# Patient Record
Sex: Male | Born: 1935 | Race: White | Hispanic: No | Smoking: Never smoker
Health system: Southern US, Community
[De-identification: ages and names within clinical notes are randomized; demographics above are authoritative.]

## PROBLEM LIST (undated history)

## (undated) DIAGNOSIS — I10 Essential (primary) hypertension: Secondary | ICD-10-CM

## (undated) DIAGNOSIS — F329 Major depressive disorder, single episode, unspecified: Secondary | ICD-10-CM

## (undated) DIAGNOSIS — N400 Enlarged prostate without lower urinary tract symptoms: Secondary | ICD-10-CM

## (undated) DIAGNOSIS — G309 Alzheimer's disease, unspecified: Secondary | ICD-10-CM

## (undated) DIAGNOSIS — F0280 Dementia in other diseases classified elsewhere without behavioral disturbance: Secondary | ICD-10-CM

---

## 2017-09-24 ENCOUNTER — Emergency Department (HOSPITAL_COMMUNITY): Payer: Medicare Other

## 2017-09-24 ENCOUNTER — Emergency Department (HOSPITAL_COMMUNITY)
Admission: EM | Admit: 2017-09-24 | Discharge: 2017-09-24 | Disposition: A | Discharge status: Home / Self Care | Payer: Medicare Other | Source: Home / Self Care | Attending: Emergency Medicine | Admitting: Emergency Medicine

## 2017-09-24 ENCOUNTER — Encounter (HOSPITAL_COMMUNITY): Payer: Self-pay | Admitting: Emergency Medicine

## 2017-09-24 ENCOUNTER — Encounter (HOSPITAL_COMMUNITY): Payer: Self-pay

## 2017-09-24 ENCOUNTER — Emergency Department (HOSPITAL_COMMUNITY)
Admission: EM | Admit: 2017-09-24 | Discharge: 2017-09-24 | Disposition: A | Discharge status: Skilled Nursing Facility | Payer: Medicare Other | Attending: Emergency Medicine | Admitting: Emergency Medicine

## 2017-09-24 ENCOUNTER — Other Ambulatory Visit: Payer: Self-pay

## 2017-09-24 DIAGNOSIS — G309 Alzheimer's disease, unspecified: Secondary | ICD-10-CM | POA: Insufficient documentation

## 2017-09-24 DIAGNOSIS — Y999 Unspecified external cause status: Secondary | ICD-10-CM | POA: Diagnosis not present

## 2017-09-24 DIAGNOSIS — W06XXXA Fall from bed, initial encounter: Secondary | ICD-10-CM | POA: Diagnosis not present

## 2017-09-24 DIAGNOSIS — W19XXXA Unspecified fall, initial encounter: Secondary | ICD-10-CM

## 2017-09-24 DIAGNOSIS — S0990XA Unspecified injury of head, initial encounter: Secondary | ICD-10-CM

## 2017-09-24 DIAGNOSIS — I10 Essential (primary) hypertension: Secondary | ICD-10-CM

## 2017-09-24 DIAGNOSIS — Y92122 Bedroom in nursing home as the place of occurrence of the external cause: Secondary | ICD-10-CM | POA: Insufficient documentation

## 2017-09-24 DIAGNOSIS — Y939 Activity, unspecified: Secondary | ICD-10-CM | POA: Insufficient documentation

## 2017-09-24 DIAGNOSIS — S0101XA Laceration without foreign body of scalp, initial encounter: Secondary | ICD-10-CM | POA: Diagnosis present

## 2017-09-24 DIAGNOSIS — S0990XD Unspecified injury of head, subsequent encounter: Secondary | ICD-10-CM | POA: Insufficient documentation

## 2017-09-24 HISTORY — DX: Dementia in other diseases classified elsewhere, unspecified severity, without behavioral disturbance, psychotic disturbance, mood disturbance, and anxiety: F02.80

## 2017-09-24 HISTORY — DX: Benign prostatic hyperplasia without lower urinary tract symptoms: N40.0

## 2017-09-24 HISTORY — DX: Essential (primary) hypertension: I10

## 2017-09-24 HISTORY — DX: Major depressive disorder, single episode, unspecified: F32.9

## 2017-09-24 HISTORY — DX: Alzheimer's disease, unspecified: G30.9

## 2017-09-24 NOTE — ED Notes (Signed)
[facility] Expected date:  Expected time:  Means of arrival:  Comments: [facility] 84 [facility] head

## 2017-09-24 NOTE — ED Notes (Signed)
Pt diaper changed. EAting meal.

## 2017-09-24 NOTE — ED Provider Notes (Addendum)
[facility]   CSN: [id number] Arrival date & time: 09/24/17  0235     History   Chief Complaint Chief Complaint  Patient presents with  . Fall  . Head Laceration    HPI Andrew GibsonMarvin Henderson is a 84 y.o. male.  The history is provided by the EMS personnel. The history is limited by the condition of the patient (level 5 due to dementia).  Fall  This is a new problem. The current episode started less than 1 hour ago. The problem occurs constantly. The problem has not changed since onset.Pertinent negatives include no abdominal pain and no shortness of breath. Nothing aggravates the symptoms. Nothing relieves the symptoms. He has tried nothing for the symptoms. The treatment provided no relief.    Past Medical History:  Diagnosis Date  . Alzheimer's dementia   . Enlarged prostate   . Hypertension   . Major depressive disorder     There are no active problems to display for this patient.   History reviewed. No pertinent surgical history.     Home Medications    Prior to Admission medications   Not on File    Family History No family history on file.  Social History Social History   Tobacco Use  . Smoking status: Unknown If Ever Smoked  Substance Use Topics  . Alcohol use: No    Frequency: Never  . Drug use: No     Allergies   Nifedipine er   Review of Systems Review of Systems  Unable to perform ROS: Dementia  Constitutional: Negative for fever.  Respiratory: Negative for shortness of breath.   Gastrointestinal: Negative for abdominal pain.     Physical Exam Updated Vital Signs BP 112/82 (BP Location: Left Arm)   Pulse 64   Temp 98.4 F (36.9 C) (Oral)   Resp 17   SpO2 95%   Physical Exam  Constitutional: He appears well-developed and well-nourished. No distress.  HENT:  Head: Normocephalic.  Right Ear: External ear normal.  Left Ear: External ear normal.  Mouth/Throat: Oropharynx is clear and  moist. No oropharyngeal exudate.  8 mm laceration of the right temporal bone  Eyes: Conjunctivae are normal. Pupils are equal, round, and reactive to light.  Neck: Normal range of motion. Neck supple.  Cardiovascular: Normal rate, regular rhythm, normal heart sounds and intact distal pulses.  Pulmonary/Chest: Effort normal and breath sounds normal. No stridor. He has no wheezes. He has no rales.  Abdominal: Soft. Bowel sounds are normal. He exhibits no mass. There is no tenderness. There is no rebound and no guarding.  Musculoskeletal: Normal range of motion. He exhibits no deformity.       Right wrist: Normal.       Left wrist: Normal.       Right hip: Normal.       Left hip: Normal.       Right knee: Normal.       Left knee: Normal.       Right ankle: Normal. Achilles tendon normal.       Left ankle: Normal. Achilles tendon normal.       Cervical back: Normal.       Thoracic back: Normal.  Neurological: He is alert. He displays normal reflexes.  Skin: Skin is warm and dry. Capillary refill takes less than 2 seconds.     ED Treatments / Results   Vitals:   09/24/17 0246 09/24/17 0249  BP: 112/82   Pulse:  64   Resp: 17   Temp: 98.4 F (36.9 C)   SpO2: 95% 95%    Procedures .Marland KitchenLaceration Repair Date/Time: 09/24/2017 5:23 AM Performed by: Cy Blamer, MD Authorized by: Cy Blamer, MD   Consent:    Consent obtained:  Emergent situation   Risks discussed:  Infection, pain, poor cosmetic result, poor wound healing, nerve damage, need for additional repair, retained foreign body, tendon damage and vascular damage   Alternatives discussed:  No treatment Anesthesia (see MAR for exact dosages):    Anesthesia method:  None Laceration details:    Location:  Scalp   Scalp location:  R temporal   Length (cm):  0.8   Depth (mm):  1 Repair type:    Repair type:  Simple Pre-procedure details:    Preparation:  Patient was prepped and draped in usual sterile  fashion Exploration:    Hemostasis achieved with:  Direct pressure   Wound exploration: wound explored through full range of motion     Wound extent: no areolar tissue violation noted   Treatment:    Area cleansed with:  Hibiclens and Betadine   Amount of cleaning:  Extensive Skin repair:    Repair method:  Staples   Number of staples:  1 Approximation:    Approximation:  Close   Vermilion border: well-aligned   Post-procedure details:    Dressing:  Open (no dressing)   Patient tolerance of procedure:  Tolerated well, no immediate complications   (including critical care time)    Final Clinical Impressions(s) / ED Diagnoses  Return for weakness, numbness, changes in vision or speech, fevers >100.4 unrelieved by medication, shortness of breath, intractable vomiting, or diarrhea, abdominal pain, Inability to tolerate liquids or food, cough, altered mental status or any concerns. No signs of systemic illness or infection. The patient is nontoxic-appearing on exam and vital signs are within normal limits.   I have reviewed the triage vital signs and the nursing notes. Pertinent labs &imaging results that were available during my care of the patient were reviewed by me and considered in my medical decision making (see chart for details).  After history, exam, and medical workup I feel the patient has been appropriately medically screened and is safe for discharge home. Pertinent diagnoses were discussed with the patient. Patient was given return precautions.    Jatasia Gundrum, MD 09/24/17 Vicie Mutters, Emaree Chiu, MD 09/24/17 Feliz Beam, Taci Sterling, MD 09/24/17 [id number]

## 2017-09-24 NOTE — ED Notes (Signed)
Pt placed in hallway to wait for [facility] pick up; Pt given milk to drink

## 2017-09-24 NOTE — ED Provider Notes (Signed)
[facility] EMERGENCY DEPARTMENT Provider Note   CSN: [id number] Arrival date & time: 09/24/17  1300     History   Chief Complaint Chief Complaint  Patient presents with  . Facial Droop   Level 5 caveat: Dementia  HPI Andrew Henderson is a 84 y.o. male.  HPI 84 year old male with a history of dementia who is brought to the emergency department after noticing a possible facial droop while at the nursing facility today.  He was seen early this morning for mechanical fall and received 1 staple in his right parietal scalp.  Head CT at that time was normal.  Patient reports no headache at this time.  He feels fine.  He denies weakness of his arms or legs.  He moves all 4 extremities equally.     Past Medical History:  Diagnosis Date  . Alzheimer's dementia   . Enlarged prostate   . Hypertension   . Major depressive disorder     There are no active problems to display for this patient.   History reviewed. No pertinent surgical history.     Home Medications    Prior to Admission medications   Not on File    Family History No family history on file.  Social History Social History   Tobacco Use  . Smoking status: Unknown If Ever Smoked  Substance Use Topics  . Alcohol use: No    Frequency: Never  . Drug use: No     Allergies   Nifedipine er   Review of Systems Review of Systems  Unable to perform ROS: Dementia     Physical Exam Updated Vital Signs BP 114/66   Pulse (!) 57   Temp 98.5 F (36.9 C) (Oral)   Resp (!) 21   Ht 6' (1.829 m)   Wt 99.3 kg (218 lb 14.7 oz)   SpO2 94%   BMI 29.69 kg/m   Physical Exam  Constitutional: He appears well-developed and well-nourished.  HENT:  Head: Normocephalic and atraumatic.  Eyes: EOM are normal. Pupils are equal, round, and reactive to light.  Neck: Normal range of motion.  Cardiovascular: Normal rate, regular rhythm, normal heart sounds and intact distal pulses.  Pulmonary/Chest:  Effort normal and breath sounds normal. No respiratory distress.  Abdominal: Soft. He exhibits no distension. There is no tenderness.  Musculoskeletal: Normal range of motion.  Neurological: He is alert.  5/5 strength in major muscle groups of  bilateral upper and lower extremities. Speech normal. No facial asymetry.   Skin: Skin is warm and dry.  Psychiatric: He has a normal mood and affect. Judgment normal.  Nursing note and vitals reviewed.    ED Treatments / Results  Labs (all labs ordered are listed, but only abnormal results are displayed) Labs Reviewed - No data to display  EKG  EKG Interpretation None       Radiology Ct Head Wo Contrast  Result Date: 09/24/2017 CLINICAL DATA:  Larey Seat out of bed. Laceration to scalp. Focal neuro deficit, less than 6 hours, stroke suspected. Was discharged after initial evaluation and subsequently developed a left-sided facial droop. EXAM: CT HEAD WITHOUT CONTRAST TECHNIQUE: Contiguous axial images were obtained from the base of the skull through the vertex without intravenous contrast. COMPARISON:  CT head without contrast 09/24/2017. FINDINGS: Brain: Advanced atrophy and white matter disease are unchanged. Multiple remote lacunar infarcts are again noted within the basal ganglia bilaterally. No acute hemorrhage or mass lesion is present. No acute cortical infarct is present.  No significant extra-axial fluid collection is present. Ventricles are proportionate to the degree of atrophy. White matter changes extend into the brainstem. The cerebellum is unremarkable. Vascular: Extensive vascular calcifications are present. There is no hyperdense vessel. Internal carotid arteries and basilar artery are ectatic. Skull: Calvarium is intact. Acute or healing fracture is present. Right parietal scalp skin staple is noted. Sinuses/Orbits: The paranasal sinuses are clear. A right mastoid effusion is again seen. The left mastoid air cells are clear. IMPRESSION: 1.  Stable advanced atrophy and diffuse white matter disease. 2. No acute intracranial abnormality. 3. Atherosclerosis. 4. Skin staples within the right parietal scalp. 5. Stable right mastoid effusion. No obstructing nasopharyngeal lesion or underlying fracture. Electronically Signed   By: Marin Roberts M.D.   On: 09/24/2017 14:53   Ct Head Wo Contrast  Result Date: 09/24/2017 CLINICAL DATA:  Initial evaluation for acute trauma, fall. EXAM: CT HEAD WITHOUT CONTRAST CT CERVICAL SPINE WITHOUT CONTRAST TECHNIQUE: Multidetector CT imaging of the head and cervical spine was performed following the standard protocol without intravenous contrast. Multiplanar CT image reconstructions of the cervical spine were also generated. COMPARISON:  None. FINDINGS: CT HEAD FINDINGS Brain: Advanced cerebral atrophy with chronic small vessel ischemic disease. No acute intracranial hemorrhage. No acute large vessel territory infarct. No mass lesion, midline shift or mass effect. Diffuse ventricular prominence related to global parenchymal volume loss without hydrocephalus. No extra-axial fluid collection. Vascular: No hyperdense vessel. Extensive intracranial atherosclerotic change noted. Skull: Small soft tissue contusion with laceration present at the right parietal scalp. Calvarium intact. Sinuses/Orbits: Globes and orbital soft tissues within normal limits. Patient status post lens extraction bilaterally. Paranasal sinuses are clear. Right mastoid effusion noted. Other: None. CT CERVICAL SPINE FINDINGS Alignment: Straightening of the normal cervical lordosis. No listhesis. Skull base and vertebrae: Skull base intact. Normal C1-2 articulations are preserved in the dens is intact. Vertebral body heights are maintained. No acute fracture. Soft tissues and spinal canal: Soft tissues of the neck demonstrate no acute abnormality. No abnormal prevertebral edema. Spinal canal within normal limits. Disc levels: Prominent bridging  anterior osteophytes at C5-6 and C6-7 with partial ankylosis. Multilevel facet arthrosis, most notable at C7-T1 on the left. Conchae cavity at the right aspect of the C6 vertebral body likely due to tortuous right vertebral artery. Upper chest: Visualized upper chest demonstrates no acute abnormality. Partially visualized lung apices are grossly clear. Other: None. IMPRESSION: CT BRAIN: 1. No acute intracranial abnormality. 2. Small right parietal scalp contusion with laceration. No calvarial fracture. 3. Advanced cerebral atrophy with chronic small vessel ischemic disease. CT CERVICAL SPINE: 1. No acute traumatic injury within cervical spine. 2. Moderate degenerative spondylolysis at C5-6 and C6-7. Electronically Signed   By: Rise Mu M.D.   On: 09/24/2017 05:11   Ct Cervical Spine Wo Contrast  Result Date: 09/24/2017 CLINICAL DATA:  Initial evaluation for acute trauma, fall. EXAM: CT HEAD WITHOUT CONTRAST CT CERVICAL SPINE WITHOUT CONTRAST TECHNIQUE: Multidetector CT imaging of the head and cervical spine was performed following the standard protocol without intravenous contrast. Multiplanar CT image reconstructions of the cervical spine were also generated. COMPARISON:  None. FINDINGS: CT HEAD FINDINGS Brain: Advanced cerebral atrophy with chronic small vessel ischemic disease. No acute intracranial hemorrhage. No acute large vessel territory infarct. No mass lesion, midline shift or mass effect. Diffuse ventricular prominence related to global parenchymal volume loss without hydrocephalus. No extra-axial fluid collection. Vascular: No hyperdense vessel. Extensive intracranial atherosclerotic change noted. Skull: Small soft tissue contusion with  laceration present at the right parietal scalp. Calvarium intact. Sinuses/Orbits: Globes and orbital soft tissues within normal limits. Patient status post lens extraction bilaterally. Paranasal sinuses are clear. Right mastoid effusion noted. Other: None.  CT CERVICAL SPINE FINDINGS Alignment: Straightening of the normal cervical lordosis. No listhesis. Skull base and vertebrae: Skull base intact. Normal C1-2 articulations are preserved in the dens is intact. Vertebral body heights are maintained. No acute fracture. Soft tissues and spinal canal: Soft tissues of the neck demonstrate no acute abnormality. No abnormal prevertebral edema. Spinal canal within normal limits. Disc levels: Prominent bridging anterior osteophytes at C5-6 and C6-7 with partial ankylosis. Multilevel facet arthrosis, most notable at C7-T1 on the left. Conchae cavity at the right aspect of the C6 vertebral body likely due to tortuous right vertebral artery. Upper chest: Visualized upper chest demonstrates no acute abnormality. Partially visualized lung apices are grossly clear. Other: None. IMPRESSION: CT BRAIN: 1. No acute intracranial abnormality. 2. Small right parietal scalp contusion with laceration. No calvarial fracture. 3. Advanced cerebral atrophy with chronic small vessel ischemic disease. CT CERVICAL SPINE: 1. No acute traumatic injury within cervical spine. 2. Moderate degenerative spondylolysis at C5-6 and C6-7. Electronically Signed   By: Rise Mu M.D.   On: 09/24/2017 05:11    Procedures Procedures (including critical care time)  Medications Ordered in ED Medications - No data to display   Initial Impression / Assessment and Plan / ED Course  I have reviewed the triage vital signs and the nursing notes.  Pertinent labs & imaging results that were available during my care of the patient were reviewed by me and considered in my medical decision making (see chart for details).     Patient is overall well-appearing.  I do not notice a facial droop at this time.  He has 5 out of 5 strength of his bilateral arms and legs.  His repeat head CT demonstrates no abnormality.  I do not think he has had a stroke.  Overall well-appearing.  Discharged back to the  nursing facility.   Final Clinical Impressions(s) / ED Diagnoses   Final diagnoses:  Minor head injury, initial encounter    ED Discharge Orders    None       Azalia Bilis, MD 09/24/17 1547

## 2017-09-24 NOTE — ED Notes (Signed)
Called [facility] for transport  

## 2017-09-24 NOTE — ED Triage Notes (Signed)
Pt arrives by [facility] from [facility] after they noticed he had left sided facial droop. LSN eating breakfast at 0800. [facility] after falling out of bed and hitting head on his dresser, pt received one staple in right side of his head. Pt is alert and oriented to person and place.

## 2017-09-24 NOTE — ED Triage Notes (Signed)
[facility] complaints of rolling out of the bed and struck dresser at bedside with small laceration right side of head-No LOC-no blood thinners. Patient has dementia and is very HOH. Bleeding controlled at this time.

## 2017-09-24 NOTE — ED Notes (Signed)
Patient has a small 1-1 2/2 inch lac to right parietal area-superficial-no gaping-cleaned with wound cleanser and bleeding is controlled

## 2017-10-05 ENCOUNTER — Encounter (HOSPITAL_COMMUNITY): Payer: Self-pay | Admitting: Emergency Medicine

## 2017-10-05 ENCOUNTER — Emergency Department (HOSPITAL_COMMUNITY): Payer: Medicare Other

## 2017-10-05 ENCOUNTER — Emergency Department (HOSPITAL_COMMUNITY)
Admission: EM | Admit: 2017-10-05 | Discharge: 2017-10-05 | Disposition: A | Discharge status: Skilled Nursing Facility | Payer: Medicare Other | Attending: Emergency Medicine | Admitting: Emergency Medicine

## 2017-10-05 ENCOUNTER — Other Ambulatory Visit: Payer: Self-pay

## 2017-10-05 DIAGNOSIS — S0990XA Unspecified injury of head, initial encounter: Secondary | ICD-10-CM | POA: Diagnosis present

## 2017-10-05 DIAGNOSIS — Y939 Activity, unspecified: Secondary | ICD-10-CM | POA: Insufficient documentation

## 2017-10-05 DIAGNOSIS — I10 Essential (primary) hypertension: Secondary | ICD-10-CM | POA: Diagnosis not present

## 2017-10-05 DIAGNOSIS — Y9210 Unspecified residential institution as the place of occurrence of the external cause: Secondary | ICD-10-CM | POA: Diagnosis not present

## 2017-10-05 DIAGNOSIS — G309 Alzheimer's disease, unspecified: Secondary | ICD-10-CM | POA: Diagnosis not present

## 2017-10-05 DIAGNOSIS — Z7982 Long term (current) use of aspirin: Secondary | ICD-10-CM | POA: Insufficient documentation

## 2017-10-05 DIAGNOSIS — S42294A Other nondisplaced fracture of upper end of right humerus, initial encounter for closed fracture: Secondary | ICD-10-CM | POA: Insufficient documentation

## 2017-10-05 DIAGNOSIS — Y999 Unspecified external cause status: Secondary | ICD-10-CM | POA: Insufficient documentation

## 2017-10-05 DIAGNOSIS — Z79899 Other long term (current) drug therapy: Secondary | ICD-10-CM | POA: Diagnosis not present

## 2017-10-05 DIAGNOSIS — S42214A Unspecified nondisplaced fracture of surgical neck of right humerus, initial encounter for closed fracture: Secondary | ICD-10-CM

## 2017-10-05 DIAGNOSIS — W19XXXA Unspecified fall, initial encounter: Secondary | ICD-10-CM | POA: Insufficient documentation

## 2017-10-05 NOTE — ED Notes (Signed)
Phone [facility].

## 2017-10-05 NOTE — Discharge Instructions (Signed)
Right humerus is broken.  He needs to stay in sling until seen by orthopedics. Call their office on Monday to make him an appt for follow-up. Can give tylenol/motrin for pain. Return to the ED for new or worsening symptoms.

## 2017-10-05 NOTE — ED Notes (Signed)
Bed: WA13 Expected date:  Expected time:  Means of arrival:  Comments: EMS 

## 2017-10-05 NOTE — ED Notes (Signed)
[facility] called for transportation  

## 2017-10-05 NOTE — ED Provider Notes (Signed)
[facility]-EMERGENCY DEPT Provider Note   CSN: [id number] Arrival date & time: 10/05/17  4098     History   Chief Complaint Chief Complaint  Patient presents with  . Fall    HPI Andrew Henderson is a 84 y.o. male.  The history is provided by the patient and the EMS personnel.  Fall     Level 5 caveat: Dementia 84 year old male with history of Alzheimer's, BPH, hypertension, depression, presenting to the ED after a fall.  Circumstances surrounding this are somewhat inconsistent.  Patient was reportedly found in the floor beside his bed stating he fell while trying to get out of bed.  When staff found him he was lying on top of his comforter with a pillow beneath his right shoulder.  Patient has a Comptroller at facility due to some inappropriate sexual behavior as well as wandering during the night, however sitter did not witness fall.  Patient has abrasion behind the right ear, states he struck his head on the floor when he fell.  Believes he had likely passed out but is not entirely sure.  Denies any neck pain.  Has significant pain in the right shoulder, worse with movement.  He is right-hand dominant.  He is not currently on anticoagulation.  Past Medical History:  Diagnosis Date  . Alzheimer's dementia   . Enlarged prostate   . Hypertension   . Major depressive disorder     There are no active problems to display for this patient.   History reviewed. No pertinent surgical history.      Home Medications    Prior to Admission medications   Medication Sig Start Date End Date Taking? Authorizing Provider  amLODipine (NORVASC) 5 MG tablet Take 5 mg by mouth daily.    [provider]  aspirin 81 MG chewable tablet Chew 81 mg by mouth daily.    [provider]  donepezil (ARICEPT) 10 MG tablet Take 10 mg by mouth at bedtime.    [provider]  finasteride (PROSCAR) 5 MG tablet Take 5 mg by mouth daily.    [provider]    ibuprofen (ADVIL,MOTRIN) 400 MG tablet Take 400 mg by mouth 2 (two) times daily.    [provider]  ipratropium-albuterol (DUONEB) 0.5-2.5 (3) MG/3ML SOLN Take 3 mLs by nebulization 4 (four) times daily.    [provider]  lisinopril (PRINIVIL,ZESTRIL) 10 MG tablet Take 10 mg by mouth daily.    [provider]  Loratadine (CLARITIN REDITABS) 5 MG TBDP Take 5 mg by mouth daily.    [provider]  medroxyPROGESTERone (PROVERA) 2.5 MG tablet Take 7.5 mg by mouth daily.    [provider]  Melatonin 3 MG TABS Take 6 mg by mouth at bedtime.    [provider]  mirabegron ER (MYRBETRIQ) 25 MG TB24 tablet Take 25 mg by mouth daily.    [provider]  omeprazole (PRILOSEC) 20 MG capsule Take 20 mg by mouth daily.    [provider]  PARoxetine (PAXIL) 30 MG tablet Take 30 mg by mouth daily.    [provider]  polyethylene glycol (MIRALAX / GLYCOLAX) packet Take 17 g by mouth daily as needed for moderate constipation.    [provider]  risperiDONE (RISPERDAL) 0.25 MG tablet Take 0.25 mg by mouth as needed (behavioral disturbance).    [provider]  tamsulosin (FLOMAX) 0.4 MG CAPS capsule Take 0.4 mg by mouth.    [provider]  torsemide (DEMADEX) 20 MG tablet Take 20 mg by mouth daily.    [provider]  Vitamin D, Ergocalciferol, (DRISDOL) 50000 units CAPS capsule Take 50,000 Units by mouth every 7 (seven) days.    [provider]    Family History History reviewed. No pertinent family history.  Social History Social History   Tobacco Use  . Smoking status: Unknown If Ever Smoked  . Smokeless tobacco: Never Used  Substance Use Topics  . Alcohol use: No    Frequency: Never  . Drug use: No     Allergies   Nifedipine er   Review of Systems Review of Systems  Unable to perform ROS: Other     Physical Exam Updated Vital Signs BP (!) 146/115 (BP  Location: Right Arm)   Pulse 67   Temp 98.4 F (36.9 C) (Oral)   Resp 16   SpO2 93% Comment: 2L  Physical Exam  Constitutional: He appears well-developed and well-nourished.  Elderly, hard of hearing  HENT:  Head: Normocephalic and atraumatic.  Mouth/Throat: Oropharynx is clear and moist.  Abrasion behind right ear; there is dried blood surrounding but no active bleeding; no apparent laceration or deformity; mid-face stable  Eyes: Pupils are equal, round, and reactive to light. Conjunctivae and EOM are normal.  Neck: No spinous process tenderness present. Normal range of motion present.  Immobilized with towel roll; no apparent tenderness or deformities; no step-off  Cardiovascular: Normal rate, regular rhythm and normal heart sounds.  Pulmonary/Chest: Effort normal and breath sounds normal. No stridor. No respiratory distress.  Abdominal: Soft. Bowel sounds are normal. There is no tenderness. There is no rebound.  Musculoskeletal: Normal range of motion.  Right shoulder with tenderness over anterior aspect; pain with any attempted movement; no tenderness of the elbow, forearm, wrist, hand, or fingers Pelvis non-tender, no leg shortening or rotation  Neurological: He is alert.  AAOx2 which is baseline, following commands and answering questions when prompted  Skin: Skin is warm and dry.  Psychiatric: He has a normal mood and affect.  Nursing note and vitals reviewed.    ED Treatments / Results  Labs (all labs ordered are listed, but only abnormal results are displayed) Labs Reviewed - No data to display  EKG None  Radiology Dg Shoulder Right  Result Date: 10/05/2017 CLINICAL DATA:  RIGHT shoulder pain, found down. EXAM: RIGHT SHOULDER - 2+ VIEW COMPARISON:  None. FINDINGS: Acute transverse fracture through surgical neck in alignment. No dislocation. No destructive bony lesions. Osteopenia. Soft tissue planes are nonsuspicious. IMPRESSION: Acute nondisplaced RIGHT humerus  fracture through surgical neck. No dislocation. Electronically Signed   By: Awilda Metro M.D.   On: 10/05/2017 03:47   Ct Head Wo Contrast  Result Date: 10/05/2017 CLINICAL DATA:  Found down by nursing staff. RIGHT ear laceration. History of Alzheimer's disease, hypertension. EXAM: CT HEAD WITHOUT CONTRAST CT CERVICAL SPINE WITHOUT CONTRAST TECHNIQUE: Multidetector CT imaging of the head and cervical spine was performed following the standard protocol without intravenous contrast. Multiplanar CT image reconstructions of the cervical spine were also generated. COMPARISON:  CT HEAD and cervical spine September 24, 2017. FINDINGS: CT HEAD FINDINGS BRAIN: No intraparenchymal hemorrhage, mass effect nor midline shift. Moderate parenchymal brain volume loss. Disproportionate sulcal effacement at the convexities. Patchy basal ganglia and thalami hypodensities most compatible with chronic small vessel ischemic disease. Confluent supratentorial white matter hypodensities. No acute large vascular territory infarcts. No abnormal extra-axial fluid collections. Basal cisterns are patent. VASCULAR: Severe calcific  atherosclerosis of the carotid siphons. Dolichoectatic intracranial vessels seen with chronic hypertension. SKULL: No skull fracture. No significant scalp soft tissue swelling. Skin staples RIGHT frontal scalp. SINUSES/ORBITS: RIGHT mastoid effusion without air cell coalescence. LEFT mastoid aircells and included paranasal sinuses are well aerated. Included ocular globes and orbital contents are non-suspicious. Status post bilateral ocular lens implants. OTHER: None. CT CERVICAL SPINE FINDINGS ALIGNMENT: Straightened lordosis.  Vertebral bodies in alignment. SKULL BASE AND VERTEBRAE: Cervical vertebral bodies and posterior elements are intact. Moderate C5-6 and C6-7 disc height loss with intradiscal calcifications. Mild C4-5 degenerative disc. Osteopenia without destructive bony lesions. C1-2 articulation  maintained with mild arthropathy. SOFT TISSUES AND SPINAL CANAL: Nonacute. Stable 2.5 cm LEFT thyroid nodule which would be better characterized with thyroid sonography on nonemergent basis. Dolichoectatic vertebral artery's. DISC LEVELS: No significant osseous canal stenosis or neural foraminal narrowing. UPPER CHEST: Lung apices are clear. OTHER: None. IMPRESSION: CT HEAD: 1. No acute intracranial process. 2. 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 3. Severe chronic small vessel ischemic disease. CT CERVICAL SPINE: 1. No fracture or malalignment. Stable degenerative change of the cervical spine. Electronically Signed   By: Awilda Metroourtnay  Bloomer M.D.   On: 10/05/2017 03:45   Ct Cervical Spine Wo Contrast  Result Date: 10/05/2017 CLINICAL DATA:  Found down by nursing staff. RIGHT ear laceration. History of Alzheimer's disease, hypertension. EXAM: CT HEAD WITHOUT CONTRAST CT CERVICAL SPINE WITHOUT CONTRAST TECHNIQUE: Multidetector CT imaging of the head and cervical spine was performed following the standard protocol without intravenous contrast. Multiplanar CT image reconstructions of the cervical spine were also generated. COMPARISON:  CT HEAD and cervical spine September 24, 2017. FINDINGS: CT HEAD FINDINGS BRAIN: No intraparenchymal hemorrhage, mass effect nor midline shift. Moderate parenchymal brain volume loss. Disproportionate sulcal effacement at the convexities. Patchy basal ganglia and thalami hypodensities most compatible with chronic small vessel ischemic disease. Confluent supratentorial white matter hypodensities. No acute large vascular territory infarcts. No abnormal extra-axial fluid collections. Basal cisterns are patent. VASCULAR: Severe calcific atherosclerosis of the carotid siphons. Dolichoectatic intracranial vessels seen with chronic hypertension. SKULL: No skull fracture. No significant scalp soft tissue swelling. Skin staples RIGHT  frontal scalp. SINUSES/ORBITS: RIGHT mastoid effusion without air cell coalescence. LEFT mastoid aircells and included paranasal sinuses are well aerated. Included ocular globes and orbital contents are non-suspicious. Status post bilateral ocular lens implants. OTHER: None. CT CERVICAL SPINE FINDINGS ALIGNMENT: Straightened lordosis.  Vertebral bodies in alignment. SKULL BASE AND VERTEBRAE: Cervical vertebral bodies and posterior elements are intact. Moderate C5-6 and C6-7 disc height loss with intradiscal calcifications. Mild C4-5 degenerative disc. Osteopenia without destructive bony lesions. C1-2 articulation maintained with mild arthropathy. SOFT TISSUES AND SPINAL CANAL: Nonacute. Stable 2.5 cm LEFT thyroid nodule which would be better characterized with thyroid sonography on nonemergent basis. Dolichoectatic vertebral artery's. DISC LEVELS: No significant osseous canal stenosis or neural foraminal narrowing. UPPER CHEST: Lung apices are clear. OTHER: None. IMPRESSION: CT HEAD: 1. No acute intracranial process. 2. 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 3. Severe chronic small vessel ischemic disease. CT CERVICAL SPINE: 1. No fracture or malalignment. Stable degenerative change of the cervical spine. Electronically Signed   By: Awilda Metroourtnay  Bloomer M.D.   On: 10/05/2017 03:45    Procedures Procedures (including critical care time)  Medications Ordered in ED Medications - No data to display   Initial Impression / Assessment and Plan / ED Course  I have reviewed the triage vital  signs and the nursing notes.  Pertinent labs & imaging results that were available during my care of the patient were reviewed by me and considered in my medical decision making (see chart for details).  84 y.o. M here after alleged fall.  History is somewhat inconsistent with this as he was found on the floor, on top of his comforter with pillow beneath right shoulder.   He does have abrasion behind the right ear but no open wounds that would require repair.  States he thinks he lost consciousness when he fell.  He is awake, alert, oriented to self and place which is his baseline.  I do not appreciate any focal neurologic deficits.  He is not currently on anticoagulation.  Does have significant right shoulder pain, worsened with any type of movement.  Arm is neurovascularly intact.  Given inconsistent history with fall and patient's known dementia, will obtain CT head and neck as well as x-ray of the right shoulder.  CT head and neck negative.  Patient does have right humeral fracture through the surgical neck.  This is nondisplaced.  He will be placed in shoulder sling and given orthopedic follow-up.  Report will be called to his facility prior to transporting back.    Final Clinical Impressions(s) / ED Diagnoses   Final diagnoses:  Fall, initial encounter  Closed nondisplaced fracture of surgical neck of right humerus, unspecified fracture morphology, initial encounter    ED Discharge Orders    None       Garlon Hatchet, PA-C 10/05/17 0531    Paula Libra, MD 10/05/17 [phone]

## 2017-10-05 NOTE — ED Triage Notes (Signed)
[facility]. Patient was found by nursing staff lying in the floor on a blanket with a pillow under rt shoulder. Nursing staff reports finding pt in that position, inconsistent will fall. Sitter was sitting outside pt door but did not have pt in view at the time of the fall.  Minor scratch behind rt ear, and pinpoint anterior rt shoulder pain. No blood thinners noted. A/O x2, able to follow commands.

## 2017-10-15 ENCOUNTER — Ambulatory Visit (INDEPENDENT_AMBULATORY_CARE_PROVIDER_SITE_OTHER): Payer: Medicare Other | Admitting: Orthopedic Surgery

## 2017-10-15 ENCOUNTER — Ambulatory Visit (INDEPENDENT_AMBULATORY_CARE_PROVIDER_SITE_OTHER): Payer: Medicare Other

## 2017-10-15 ENCOUNTER — Encounter (INDEPENDENT_AMBULATORY_CARE_PROVIDER_SITE_OTHER): Payer: Self-pay | Admitting: Orthopedic Surgery

## 2017-10-15 DIAGNOSIS — M25511 Pain in right shoulder: Secondary | ICD-10-CM

## 2017-10-16 ENCOUNTER — Encounter (INDEPENDENT_AMBULATORY_CARE_PROVIDER_SITE_OTHER): Payer: Self-pay | Admitting: Orthopedic Surgery

## 2017-10-16 NOTE — Progress Notes (Signed)
Office Visit Note   Patient: Andrew Henderson           Date of Birth: January 22, 1932           MRN: [id number] Visit Date: 10/15/2017 Requested by: No referring provider defined for this encounter. PCP: Patient, No Pcp Per  Subjective: Chief Complaint  Patient presents with  . Right Shoulder - Injury    HPI: Andrew Henderson is a patient who is a resident of a nursing home.  He fell out of his bed 10/05/2017 and sustained right proximal humerus fracture.  Outside radiographs are reviewed and show a fairly nondisplaced proximal humerus fracture.  He has been in a sling since that time.  He is noncommunicative.  His son is with him today.  He denies any other orthopedic complaints.  His activity level is mostly transfers bed to chair.  He does not walk around very much at the nursing home.  Patient has Alzheimer's.              ROS: All systems reviewed are negative as they relate to the chief complaint within the history of present illness.  Patient denies  fevers or chills.   Assessment & Plan: Visit Diagnoses:  1. Acute pain of right shoulder     Plan: Impression is right proximal humerus fracture with no real change in alignment over 10 days.  Plan is to continue immobilization for at least 11 more days and then begin shoulder pendulum exercises.  That can begin in physical therapy in 11 days.  I will see him back in about 3-4 weeks for clinical recheck and repeat radiographs.  He may be moving up to another facility.  Nonetheless we can see him back if he is here in about 3-4 weeks for clinical recheck and repeat radiographs.  Follow-Up Instructions: Return in about 1 month (around 11/12/2017).   Orders:  Orders Placed This Encounter  Procedures  . XR Shoulder Right   No orders of the defined types were placed in this encounter.     Procedures: No procedures performed   Clinical Data: No additional findings.  Objective: Vital Signs: There were no vitals taken for this visit.  Physical  Exam:   Constitutional: Patient appears well-developed HEENT:  Head: Normocephalic Eyes:EOM are normal Neck: Normal range of motion Cardiovascular: Normal rate Pulmonary/chest: Effort normal Neurologic: Patient is alert Skin: Skin is warm Psychiatric: Patient has normal mood and affect    Ortho Exam: Orthopedic exam demonstrates some ecchymosis in that proximal arm region on the right.  Motor sensory function to the hand is intact.  Deltoid fires on the right.  Shoulder is located.  No groin pain with internal/external rotation of either hip.  Specialty Comments:  No specialty comments available.  Imaging: Xr Shoulder Right  Result Date: 10/16/2017 AP outlet right proximal humerus reviewed.  No change in fracture alignment of mildly to minimally displaced proximal humerus fracture noted.  No other significant bony abnormalities present.    PMFS History: There are no active problems to display for this patient.  Past Medical History:  Diagnosis Date  . Alzheimer's dementia   . Enlarged prostate   . Hypertension   . Major depressive disorder     History reviewed. No pertinent family history.  History reviewed. No pertinent surgical history. Social History   Occupational History  . Not on file  Tobacco Use  . Smoking status: Unknown If Ever Smoked  . Smokeless tobacco: Never Used  Substance and Sexual  Activity  . Alcohol use: No    Frequency: Never  . Drug use: No  . Sexual activity: Not on file

## 2017-10-23 ENCOUNTER — Telehealth (INDEPENDENT_AMBULATORY_CARE_PROVIDER_SITE_OTHER): Payer: Self-pay | Admitting: Orthopedic Surgery

## 2017-10-23 NOTE — Telephone Encounter (Signed)
Please advise for therapy. Thanks.

## 2017-10-23 NOTE — Telephone Encounter (Signed)
I called lmom

## 2017-10-23 NOTE — Telephone Encounter (Signed)
OT-Renee needs clarification on order written for therapy. Please call Renee to advise of order.  [phone]

## 2017-11-12 ENCOUNTER — Ambulatory Visit: Payer: Medicare Other | Admitting: Neurology

## 2017-11-14 ENCOUNTER — Telehealth (INDEPENDENT_AMBULATORY_CARE_PROVIDER_SITE_OTHER): Payer: Self-pay | Admitting: Orthopedic Surgery

## 2017-11-14 NOTE — Telephone Encounter (Signed)
Patients daughter, Stanton Kidney called requesting information about care because her father will be transferred to a new facility. She is asking for a call back # [phone]

## 2017-11-14 NOTE — Telephone Encounter (Signed)
IC s/w her at length. There seemed to be confusion on care for her father. I advised needed ROV per last OV note for repeat xrays  appt scheduled for Wednesday

## 2017-11-14 NOTE — Telephone Encounter (Signed)
Patient called back and left a voicemail returning your call. She is still needing a call back # [phone]

## 2017-11-14 NOTE — Telephone Encounter (Signed)
I tried calling. No answer. Need to know what she is requesting. Dr August Saucer has only seen patient once.

## 2017-11-19 ENCOUNTER — Ambulatory Visit (INDEPENDENT_AMBULATORY_CARE_PROVIDER_SITE_OTHER): Payer: Medicare Other

## 2017-11-19 ENCOUNTER — Encounter (INDEPENDENT_AMBULATORY_CARE_PROVIDER_SITE_OTHER): Payer: Self-pay | Admitting: Orthopedic Surgery

## 2017-11-19 ENCOUNTER — Ambulatory Visit (INDEPENDENT_AMBULATORY_CARE_PROVIDER_SITE_OTHER): Payer: Medicare Other | Admitting: Orthopedic Surgery

## 2017-11-19 DIAGNOSIS — M25511 Pain in right shoulder: Secondary | ICD-10-CM

## 2017-11-19 NOTE — Progress Notes (Signed)
   Post-Op Visit Note   Patient: Andrew Henderson           Date of Birth: 04-06-1932           MRN: [id number] Visit Date: 11/19/2017 PCP: Patient, No Pcp Per   Assessment & Plan:  Chief Complaint:  Chief Complaint  Patient presents with  . Right Shoulder - Follow-up    10/05/17 right proximal humerus fracture   Visit Diagnoses:  1. Acute pain of right shoulder     Plan: Kadarius is a patient with right proximal humerus fracture.  He is about 6 weeks out.  On examination he has only mild pain with passive range of motion.  I can externally rotate the arm to about 30 to 35 degrees.  Isolated glenohumeral abduction is around 80 and isolated forward flexion is also 80.  Deltoid fires.  No crepitus with passive range of motion.  Radiographs show healed fracture.  Minimal displacement present.  Plan is physical therapy and a new place which will consist of passive stretching and strengthening.  I will see Dreden back as needed.  Follow-Up Instructions: Return if symptoms worsen or fail to improve.   Orders:  Orders Placed This Encounter  Procedures  . XR Shoulder Right  . Ambulatory referral to Physical Therapy   No orders of the defined types were placed in this encounter.   Imaging: Xr Shoulder Right  Result Date: 11/19/2017 AP outlet right shoulder reviewed.  Proximal humerus fracture shows no significant change in alignment.  No evidence of avascular necrosis.  No new fractures.  Shoulder appears located on the outlet view.   PMFS History: There are no active problems to display for this patient.  Past Medical History:  Diagnosis Date  . Alzheimer's dementia   . Enlarged prostate   . Hypertension   . Major depressive disorder     History reviewed. No pertinent family history.  History reviewed. No pertinent surgical history. Social History   Occupational History  . Not on file  Tobacco Use  . Smoking status: Unknown If Ever Smoked  . Smokeless tobacco: Never Used    Substance and Sexual Activity  . Alcohol use: No    Frequency: Never  . Drug use: No  . Sexual activity: Not on file

## 2017-11-20 ENCOUNTER — Telehealth (INDEPENDENT_AMBULATORY_CARE_PROVIDER_SITE_OTHER): Payer: Self-pay | Admitting: Orthopedic Surgery

## 2017-11-20 NOTE — Telephone Encounter (Signed)
Please advise on WTB for UE

## 2017-11-20 NOTE — Telephone Encounter (Signed)
Ok to wb with walker

## 2017-11-20 NOTE — Telephone Encounter (Signed)
Andrew Henderson Physical Therapist  Home health Care  [phone]    Pt moved to new location checking on weight bearing status pertaining to upper right

## 2017-11-20 NOTE — Telephone Encounter (Signed)
IC advised.  

## 2017-11-25 ENCOUNTER — Telehealth (INDEPENDENT_AMBULATORY_CARE_PROVIDER_SITE_OTHER): Payer: Self-pay | Admitting: Orthopedic Surgery

## 2017-11-25 NOTE — Telephone Encounter (Signed)
Verbal order given for xray

## 2017-11-25 NOTE — Telephone Encounter (Signed)
Genelle Bal called wanting to let Dr. August Saucer know that the patient recently fell on his right shoulder/arm and it's pretty swollen. Wanted to know if she should get a verbal order for a mobile xray. CB # [phone]

## 2017-11-27 NOTE — Telephone Encounter (Signed)
Genelle Bal, PT with [facility] called and would like a call back to discuss X-ray results and also follow up for patient. Her number # [phone]

## 2017-11-28 NOTE — Telephone Encounter (Signed)
Told her to have patient come in

## 2017-12-04 ENCOUNTER — Encounter (INDEPENDENT_AMBULATORY_CARE_PROVIDER_SITE_OTHER): Payer: Self-pay | Admitting: Orthopedic Surgery

## 2017-12-04 ENCOUNTER — Ambulatory Visit (INDEPENDENT_AMBULATORY_CARE_PROVIDER_SITE_OTHER): Payer: Medicare Other | Admitting: Orthopedic Surgery

## 2017-12-04 ENCOUNTER — Ambulatory Visit (INDEPENDENT_AMBULATORY_CARE_PROVIDER_SITE_OTHER): Payer: Medicare Other

## 2017-12-04 DIAGNOSIS — M25511 Pain in right shoulder: Secondary | ICD-10-CM | POA: Diagnosis not present

## 2017-12-06 ENCOUNTER — Encounter (INDEPENDENT_AMBULATORY_CARE_PROVIDER_SITE_OTHER): Payer: Self-pay | Admitting: Orthopedic Surgery

## 2017-12-06 NOTE — Progress Notes (Signed)
   Post-Op Visit Note   Patient: Eppie GibsonMarvin Scally           Date of Birth: [birth year]           MRN: [id number] Visit Date: 12/04/2017 PCP: Patient, No Pcp Per   Assessment & Plan:  Chief Complaint:  Chief Complaint  Patient presents with  . Right Shoulder - Follow-up, Fracture   Visit Diagnoses:  1. Acute pain of right shoulder     Plan: Mariana KaufmanMarvin is a patient with known proximal humerus fracture on the right.  I actually let him go 11/19/2017.  Had some type of slip/fall.  Concerned about refracture of the right shoulder is present.  On examination no new bruising or ecchymosis.  His shoulder has pretty good passive range of motion with no coarse grinding or crepitus or significant pain to the patient even though he is sustaining dementia.  Radiographs show no real change in fracture alignment compared to previous radiographs impression is healing proximal humerus fracture with no reinjury or re-displacement of the fracture fragments follow-up with me as needed no sling required  Follow-Up Instructions: Return if symptoms worsen or fail to improve.   Orders:  Orders Placed This Encounter  Procedures  . XR Shoulder Right   No orders of the defined types were placed in this encounter.   Imaging: No results found.  PMFS History: There are no active problems to display for this patient.  Past Medical History:  Diagnosis Date  . Alzheimer's dementia   . Enlarged prostate   . Hypertension   . Major depressive disorder     History reviewed. No pertinent family history.  History reviewed. No pertinent surgical history. Social History   Occupational History  . Not on file  Tobacco Use  . Smoking status: Unknown If Ever Smoked  . Smokeless tobacco: Never Used  Substance and Sexual Activity  . Alcohol use: No    Frequency: Never  . Drug use: No  . Sexual activity: Not on file

## 2018-02-27 ENCOUNTER — Other Ambulatory Visit: Payer: Self-pay

## 2018-02-27 ENCOUNTER — Emergency Department: Payer: Medicare Other

## 2018-02-27 ENCOUNTER — Emergency Department
Admission: EM | Admit: 2018-02-27 | Discharge: 2018-02-27 | Disposition: A | Discharge status: Home / Self Care | Payer: Medicare Other | Attending: Emergency Medicine | Admitting: Emergency Medicine

## 2018-02-27 ENCOUNTER — Encounter: Payer: Self-pay | Admitting: Emergency Medicine

## 2018-02-27 DIAGNOSIS — N390 Urinary tract infection, site not specified: Secondary | ICD-10-CM | POA: Diagnosis not present

## 2018-02-27 DIAGNOSIS — F0280 Dementia in other diseases classified elsewhere without behavioral disturbance: Secondary | ICD-10-CM | POA: Diagnosis not present

## 2018-02-27 DIAGNOSIS — Z79899 Other long term (current) drug therapy: Secondary | ICD-10-CM | POA: Insufficient documentation

## 2018-02-27 DIAGNOSIS — G309 Alzheimer's disease, unspecified: Secondary | ICD-10-CM | POA: Diagnosis not present

## 2018-02-27 DIAGNOSIS — Z7982 Long term (current) use of aspirin: Secondary | ICD-10-CM | POA: Insufficient documentation

## 2018-02-27 DIAGNOSIS — R4182 Altered mental status, unspecified: Secondary | ICD-10-CM | POA: Diagnosis present

## 2018-02-27 LAB — COMPREHENSIVE METABOLIC PANEL
ALT: 16 U/L (ref 0–44)
AST: 22 U/L (ref 15–41)
Albumin: 3.8 g/dL (ref 3.5–5.0)
Alkaline Phosphatase: 91 U/L (ref 38–126)
Anion gap: 10 (ref 5–15)
BILIRUBIN TOTAL: 0.9 mg/dL (ref 0.3–1.2)
BUN: 26 mg/dL — ABNORMAL HIGH (ref 8–23)
CHLORIDE: 108 mmol/L (ref 98–111)
CO2: 23 mmol/L (ref 22–32)
Calcium: 8.8 mg/dL — ABNORMAL LOW (ref 8.9–10.3)
Creatinine, Ser: 1.49 mg/dL — ABNORMAL HIGH (ref 0.61–1.24)
GFR, EST AFRICAN AMERICAN: 48 mL/min — AB (ref >60)
GFR, EST NON AFRICAN AMERICAN: 41 mL/min — AB (ref >60)
Glucose, Bld: 147 mg/dL — ABNORMAL HIGH (ref 70–99)
POTASSIUM: 3.6 mmol/L (ref 3.5–5.1)
Sodium: 141 mmol/L (ref 135–145)
TOTAL PROTEIN: 6.8 g/dL (ref 6.5–8.1)

## 2018-02-27 LAB — GLUCOSE, CAPILLARY: GLUCOSE-CAPILLARY: 142 mg/dL — AB (ref 70–99)

## 2018-02-27 LAB — CBC
HCT: 41.4 % (ref 40.0–52.0)
Hemoglobin: 14 g/dL (ref 13.0–18.0)
MCH: 31.6 pg (ref 26.0–34.0)
MCHC: 33.8 g/dL (ref 32.0–36.0)
MCV: 93.7 fL (ref 80.0–100.0)
Platelets: 202 10*3/uL (ref 150–440)
RBC: 4.42 MIL/uL (ref 4.40–5.90)
RDW: 14.2 % (ref 11.5–14.5)
WBC: 9.7 10*3/uL (ref 3.8–10.6)

## 2018-02-27 LAB — URINALYSIS, COMPLETE (UACMP) WITH MICROSCOPIC
BILIRUBIN URINE: NEGATIVE
Bacteria, UA: NONE SEEN
GLUCOSE, UA: NEGATIVE mg/dL
HGB URINE DIPSTICK: NEGATIVE
KETONES UR: NEGATIVE mg/dL
NITRITE: NEGATIVE
Protein, ur: NEGATIVE mg/dL
SPECIFIC GRAVITY, URINE: 1.008 (ref 1.005–1.030)
pH: 6 (ref 5.0–8.0)

## 2018-02-27 LAB — TROPONIN I: Troponin I: <0.03 ng/mL (ref <0.03)

## 2018-02-27 MED ORDER — CIPROFLOXACIN HCL 500 MG PO TABS: 500.0000 mg | ORAL_TABLET | Freq: Two times a day (BID) | ORAL | 0 refills | Status: AC

## 2018-02-27 MED ORDER — CIPROFLOXACIN HCL 500 MG PO TABS
500.0000 mg | ORAL_TABLET | Freq: Once | ORAL | Status: AC
  Administered 2018-02-27: 500 mg via ORAL
  Filled 2018-02-27: qty 1

## 2018-02-27 NOTE — ED Notes (Signed)
Spoke with Arline Aspindy from [facility] states the patient was seen at baseline last around 1100 today when he walked himself outside for activities. Per Arline Aspindy pt was outside for about 20 minutes when they noticed he was altered and called EMS for transport around 1130.

## 2018-02-27 NOTE — ED Provider Notes (Signed)
[facility] Emergency Department Provider Note   ____________________________________________    I have reviewed the triage vital signs and the nursing notes.   HISTORY  Chief Complaint Altered Mental Status   History limited by dementia  HPI Andrew Henderson is a 84 y.o. male with a history of Alzheimer's dementia who presents today for change of mental status.  Reportedly the patient has been less responsive than usual and this started over the last several hours, unknown exact time.  No reports of acute neuro deficits.  No reports of fever.  Patient states he feels well and has no complaints   Past Medical History:  Diagnosis Date  . Alzheimer's dementia   . Enlarged prostate   . Hypertension   . Major depressive disorder     There are no active problems to display for this patient.   History reviewed. No pertinent surgical history.  Prior to Admission medications   Medication Sig Start Date End Date Taking? Authorizing Provider  acetaminophen (TYLENOL) 500 MG tablet Take 500 mg by mouth every 4 (four) hours as needed for fever.   Yes [provider]  alum & mag hydroxide-simeth (MINTOX) 200-200-20 MG/5ML suspension Take 30 mLs by mouth as needed for indigestion or heartburn.   Yes [provider]  amLODipine (NORVASC) 5 MG tablet Take 5 mg by mouth daily.   Yes [provider]  aspirin EC 81 MG tablet Take 81 mg by mouth daily.   Yes [provider]  Cholecalciferol (VITAMIN D3) 5000 units CAPS Take 5,000 Units by mouth daily.   Yes [provider]  donepezil (ARICEPT) 10 MG tablet Take 10 mg by mouth daily.    Yes [provider]  finasteride (PROSCAR) 5 MG tablet Take 5 mg by mouth daily.   Yes [provider]  guaifenesin (ROBITUSSIN) 100 MG/5ML syrup Take 200 mg by mouth every 6 (six) hours as needed for cough.   Yes [provider]  ibuprofen (ADVIL,MOTRIN) 400 MG  tablet Take 400 mg by mouth 2 (two) times daily.   Yes [provider]  ipratropium-albuterol (DUONEB) 0.5-2.5 (3) MG/3ML SOLN Take 3 mLs by nebulization 3 (three) times daily as needed (for shortness of breath/cough).    Yes [provider]  loperamide (IMODIUM) 2 MG capsule Take 2 mg by mouth as needed for diarrhea or loose stools.   Yes [provider]  loratadine (CLARITIN) 10 MG tablet Take 5 mg by mouth daily.   Yes [provider]  magnesium hydroxide (MILK OF MAGNESIA) 400 MG/5ML suspension Take 30 mLs by mouth at bedtime as needed for mild constipation.   Yes [provider]  medroxyPROGESTERone (PROVERA) 5 MG tablet Take 7.5 mg by mouth daily.   Yes [provider]  Melatonin 3 MG TABS Take 6 mg by mouth at bedtime.   Yes [provider]  mirabegron ER (MYRBETRIQ) 25 MG TB24 tablet Take 25 mg by mouth daily.   Yes [provider]  neomycin-bacitracin-polymyxin (NEOSPORIN) ointment Apply 1 application topically as needed for wound care.   Yes [provider]  omeprazole (PRILOSEC) 20 MG capsule Take 20 mg by mouth daily.   Yes [provider]  PARoxetine (PAXIL) 40 MG tablet Take 40 mg by mouth daily.   Yes [provider]  risperiDONE (RISPERDAL) 0.25 MG tablet Take 0.25 mg by mouth 2 (two) times daily as needed (for agitation).    Yes [provider]  tamsulosin (  FLOMAX) 0.4 MG CAPS capsule Take 0.4 mg by mouth daily.    Yes [provider]  torsemide (DEMADEX) 20 MG tablet Take 20 mg by mouth daily.   Yes [provider]  traZODone (DESYREL) 50 MG tablet Take 12.5 mg by mouth 2 (two) times daily.   Yes [provider]  ciprofloxacin (CIPRO) 500 MG tablet Take 1 tablet (500 mg total) by mouth 2 (two) times daily for 10 days. 02/27/18 03/09/18  Jene Every, MD     Allergies Nifedipine er  History reviewed. No pertinent family history.  Social  History Social History   Tobacco Use  . Smoking status: Unknown If Ever Smoked  . Smokeless tobacco: Never Used  Substance Use Topics  . Alcohol use: No    Frequency: Never  . Drug use: No    Review of Systems noted by dementia  Constitutional: No reports of fevers Eyes: No visual changes.  ENT: Patient denies neck pain Cardiovascular: Denies chest pain. Respiratory: Intermittent cough Gastrointestinal: No abdominal pain.   Genitourinary: Negative for dysuria. Musculoskeletal: Negative for back pain. Skin: Negative for rash. Neurological: No reports of weakness   ____________________________________________   PHYSICAL EXAM:  VITAL SIGNS: ED Triage Vitals  Enc Vitals Group     BP 02/27/18 1200 112/81     Pulse Rate 02/27/18 1200 79     Resp 02/27/18 1200 18     Temp 02/27/18 1200 98.3 F (36.8 C)     Temp Source 02/27/18 1200 Oral     SpO2 02/27/18 1200 90 %     Weight 02/27/18 1201 99.3 kg (218 lb 14.7 oz)     Height 02/27/18 1201 1.778 m (5\' 10" )     Head Circumference --      Peak Flow --      Pain Score --      Pain Loc --      Pain Edu? --      Excl. in GC? --     Constitutional: Alert  Eyes: Conjunctivae are normal.  PERRLA, EOMI Head: Atraumatic. Nose: No congestion/rhinnorhea. Mouth/Throat: Mucous membranes are moist.   Neck:  Painless ROM Cardiovascular: Normal rate, regular rhythm. Grossly normal heart sounds.  Good peripheral circulation. Respiratory: Normal respiratory effort.  No retractions. Lungs CTAB. Gastrointestinal: Soft and nontender. No distention.  No CVA tenderness.  Musculoskeletal: No lower extremity tenderness nor edema.  Warm and well perfused, normal strength in all extremities Neurologic:  Normal speech and language. No gross focal neurologic deficits are appreciated.  Cranial nerves II through XII are normal Skin:  Skin is warm, dry and intact. No rash noted.   ____________________________________________   LABS (all  labs ordered are listed, but only abnormal results are displayed)  Labs Reviewed  GLUCOSE, CAPILLARY - Abnormal; Notable for the following components:      Result Value   Glucose-Capillary 142 (*)    All other components within normal limits  COMPREHENSIVE METABOLIC PANEL - Abnormal; Notable for the following components:   Glucose, Bld 147 (*)    BUN 26 (*)    Creatinine, Ser 1.49 (*)    Calcium 8.8 (*)    GFR calc non Af Amer 41 (*)    GFR calc Af Amer 48 (*)    All other components within normal limits  URINALYSIS, COMPLETE (UACMP) WITH MICROSCOPIC - Abnormal; Notable for the following components:   Color, Urine YELLOW (*)    APPearance CLEAR (*)    Leukocytes, UA LARGE (*)  All other components within normal limits  URINE CULTURE  CBC  TROPONIN I  CBG MONITORING, ED   ____________________________________________  EKG  None ____________________________________________  RADIOLOGY  Chest x-ray normal ____________________________________________   PROCEDURES  Procedure(s) performed: No  Procedures   Critical Care performed: No ____________________________________________   INITIAL IMPRESSION / ASSESSMENT AND PLAN / ED COURSE  Pertinent labs & imaging results that were available during my care of the patient were reviewed by me and considered in my medical decision making (see chart for details).  Patient presents with reported change in mental status, he is quiet but appears at baseline here and denies any complaints.  Check labs, urine, x-ray to evaluate for possible infection, CT head as well  ----------------------------------------- 2:41 PM on 02/27/2018 -----------------------------------------  Patient's urinalysis consistent with urinary tract infection, mild dehydration on labs, patient did receive IV fluids.  White blood cell count is normal.  Vital signs unremarkable.  Not requiring oxygen.  Patient appears to be at his baseline.  We will start  him on antibiotics for urinary tract infection, close outpatient follow-up    ____________________________________________   FINAL CLINICAL IMPRESSION(S) / ED DIAGNOSES  Final diagnoses:  Lower urinary tract infectious disease  Altered mental status, unspecified altered mental status type        Note:  This document was prepared using Dragon voice recognition software and may include unintentional dictation errors.    Jene EveryKinner, Windie Marasco, MD 02/27/18 [phone]

## 2018-02-27 NOTE — ED Triage Notes (Signed)
Pt arrived via EMS from [facility] with reports of altered mental status. Per EMS, staff found pt sitting outside in rocking chair and was found to be lethargic and non-verbal.  Pupils also pinpoint per EMS and staff reports pt does not take any narcotics.  Staff concerned pt was post-ictal, but no hx of seizures and pt did not have any incontinence.

## 2018-02-27 NOTE — ED Notes (Signed)
Spoke with patient's son Andrew Henderson who is the patient's POA- [phone]

## 2018-02-27 NOTE — ED Notes (Signed)
Pt transported to CT ?

## 2018-02-27 NOTE — ED Notes (Signed)
Updated son Mariana KaufmanMarvin with pt's status informed him the patient would be discharged back to [facility].

## 2018-02-27 NOTE — ED Notes (Signed)
[facility]

## 2018-02-27 NOTE — ED Notes (Signed)
XR in room, called lab to add on troponin, spoke with Darl PikesSusan

## 2018-02-27 NOTE — ED Notes (Signed)
Called [facility] for transport to [facility]  [phone]

## 2018-03-02 ENCOUNTER — Encounter: Payer: Self-pay | Admitting: Emergency Medicine

## 2018-03-02 ENCOUNTER — Other Ambulatory Visit: Payer: Self-pay

## 2018-03-02 ENCOUNTER — Emergency Department
Admission: EM | Admit: 2018-03-02 | Discharge: 2018-03-02 | Disposition: A | Discharge status: Home / Self Care | Payer: Medicare Other | Attending: Emergency Medicine | Admitting: Emergency Medicine

## 2018-03-02 ENCOUNTER — Emergency Department: Payer: Medicare Other

## 2018-03-02 DIAGNOSIS — F0280 Dementia in other diseases classified elsewhere without behavioral disturbance: Secondary | ICD-10-CM | POA: Diagnosis not present

## 2018-03-02 DIAGNOSIS — G309 Alzheimer's disease, unspecified: Secondary | ICD-10-CM | POA: Diagnosis not present

## 2018-03-02 DIAGNOSIS — Y92129 Unspecified place in nursing home as the place of occurrence of the external cause: Secondary | ICD-10-CM | POA: Diagnosis not present

## 2018-03-02 DIAGNOSIS — Z7982 Long term (current) use of aspirin: Secondary | ICD-10-CM | POA: Insufficient documentation

## 2018-03-02 DIAGNOSIS — S80212A Abrasion, left knee, initial encounter: Secondary | ICD-10-CM | POA: Diagnosis not present

## 2018-03-02 DIAGNOSIS — Y999 Unspecified external cause status: Secondary | ICD-10-CM | POA: Insufficient documentation

## 2018-03-02 DIAGNOSIS — Z79899 Other long term (current) drug therapy: Secondary | ICD-10-CM | POA: Insufficient documentation

## 2018-03-02 DIAGNOSIS — Y939 Activity, unspecified: Secondary | ICD-10-CM | POA: Diagnosis not present

## 2018-03-02 DIAGNOSIS — S8992XA Unspecified injury of left lower leg, initial encounter: Secondary | ICD-10-CM | POA: Diagnosis present

## 2018-03-02 DIAGNOSIS — I10 Essential (primary) hypertension: Secondary | ICD-10-CM | POA: Diagnosis not present

## 2018-03-02 DIAGNOSIS — T148XXA Other injury of unspecified body region, initial encounter: Secondary | ICD-10-CM

## 2018-03-02 DIAGNOSIS — W1830XA Fall on same level, unspecified, initial encounter: Secondary | ICD-10-CM | POA: Insufficient documentation

## 2018-03-02 LAB — URINE CULTURE: Special Requests: NORMAL

## 2018-03-02 NOTE — ED Triage Notes (Signed)
Pt in via [facility] from [facility]; per facility pt with fall a couple of days ago w/ scattered bruising and complaints of pain to left knee.  No bruising noted upon arrival.  Pt denies pain.  Vitals WDL, NAD noted a this time.

## 2018-03-02 NOTE — ED Notes (Signed)
Pt to the er to be checked out post fall. No one is sure when the pt fell. Possibly Friday. Staff at nursing home states pt has bruises all over him. No bruises noted by this RN. Pt reports no pain but when he is rolled on the left side, reports some hip pain. Pt is alert bu not oriented. Bed alarm placed on pt. VSS. Lungs clear.

## 2018-03-02 NOTE — ED Notes (Signed)
Patient is resting comfortably. 

## 2018-03-02 NOTE — ED Provider Notes (Signed)
[facility] Emergency Department Provider Note  ____________________________________________   I have reviewed the triage vital signs and the nursing notes. Where available I have reviewed prior notes and, if possible and indicated, outside hospital notes.    HISTORY  Chief Complaint Fall    HPI Andrew Henderson is a 84 y.o. male   History of severe dementia, he cannot give a history.  I did call the memory care ward where he states, they states that he was complaining of knee pain earlier today and also they noticed a scratch behind his ear and they want to make sure he had not fallen again and hit his head.  Level 5 chart caveat; no further history available due to patient status.    Past Medical History:  Diagnosis Date  . Alzheimer's dementia   . Enlarged prostate   . Hypertension   . Major depressive disorder     There are no active problems to display for this patient.   History reviewed. No pertinent surgical history.  Prior to Admission medications   Medication Sig Start Date End Date Taking? Authorizing Provider  acetaminophen (TYLENOL) 500 MG tablet Take 500 mg by mouth every 4 (four) hours as needed for fever.    [provider]  alum & mag hydroxide-simeth (MINTOX) 200-200-20 MG/5ML suspension Take 30 mLs by mouth as needed for indigestion or heartburn.    [provider]  amLODipine (NORVASC) 5 MG tablet Take 5 mg by mouth daily.    [provider]  aspirin EC 81 MG tablet Take 81 mg by mouth daily.    [provider]  Cholecalciferol (VITAMIN D3) 5000 units CAPS Take 5,000 Units by mouth daily.    [provider]  ciprofloxacin (CIPRO) 500 MG tablet Take 1 tablet (500 mg total) by mouth 2 (two) times daily for 10 days. 02/27/18 03/09/18  Jene Every, MD  donepezil (ARICEPT) 10 MG tablet Take 10 mg by mouth daily.     [provider]  finasteride (PROSCAR) 5 MG tablet Take 5 mg by mouth  daily.    [provider]  guaifenesin (ROBITUSSIN) 100 MG/5ML syrup Take 200 mg by mouth every 6 (six) hours as needed for cough.    [provider]  ibuprofen (ADVIL,MOTRIN) 400 MG tablet Take 400 mg by mouth 2 (two) times daily.    [provider]  ipratropium-albuterol (DUONEB) 0.5-2.5 (3) MG/3ML SOLN Take 3 mLs by nebulization 3 (three) times daily as needed (for shortness of breath/cough).     [provider]  loperamide (IMODIUM) 2 MG capsule Take 2 mg by mouth as needed for diarrhea or loose stools.    [provider]  loratadine (CLARITIN) 10 MG tablet Take 5 mg by mouth daily.    [provider]  magnesium hydroxide (MILK OF MAGNESIA) 400 MG/5ML suspension Take 30 mLs by mouth at bedtime as needed for mild constipation.    [provider]  medroxyPROGESTERone (PROVERA) 5 MG tablet Take 7.5 mg by mouth daily.    [provider]  Melatonin 3 MG TABS Take 6 mg by mouth at bedtime.    [provider]  mirabegron ER (MYRBETRIQ) 25 MG TB24 tablet Take 25 mg by mouth daily.    [provider]  neomycin-bacitracin-polymyxin (NEOSPORIN) ointment Apply 1 application topically as needed for wound care.    [provider]  omeprazole (PRILOSEC) 20 MG capsule Take 20 mg by mouth daily.    [provider]  PARoxetine (PAXIL) 40 MG tablet Take 40 mg by mouth daily.    [provider]  risperiDONE (RISPERDAL) 0.25 MG tablet Take 0.25 mg by mouth 2 (two) times daily as needed (for agitation).     [provider]  tamsulosin (FLOMAX) 0.4 MG CAPS capsule Take 0.4 mg by mouth daily.     [provider]  torsemide (DEMADEX) 20 MG tablet Take 20 mg by mouth daily.    [provider]  traZODone (DESYREL) 50 MG tablet Take 12.5 mg by mouth 2 (two) times daily.    [provider]    Allergies Nifedipine er  No family history on file.  Social History Social  History   Tobacco Use  . Smoking status: Unknown If Ever Smoked  . Smokeless tobacco: Never Used  Substance Use Topics  . Alcohol use: No    Frequency: Never  . Drug use: No    Review of Systems Level 5 chart caveat; no further history available due to patient status.   ____________________________________________   PHYSICAL EXAM:  VITAL SIGNS: ED Triage Vitals  Enc Vitals Group     BP 03/02/18 1630 123/74     Pulse Rate 03/02/18 1631 65     Resp 03/02/18 1830 15     Temp 03/02/18 1630 97.8 F (36.6 C)     Temp Source 03/02/18 1630 Oral     SpO2 03/02/18 1631 96 %     Weight 03/02/18 1630 218 lb 4.1 oz (99 kg)     Height 03/02/18 1630 6' (1.829 m)     Head Circumference --      Peak Flow --      Pain Score --      Pain Loc --      Pain Edu? --      Excl. in GC? --     Constitutional: No acute distress significantly demented individual Eyes: Conjunctivae are normal Head: Atraumatic HEENT: No congestion/rhinnorhea. Mucous membranes are moist.  Oropharynx non-erythematous Neck:   Nontender with no meningismus, no masses, no stridor Cardiovascular: Normal rate, regular rhythm. Grossly normal heart sounds.  Good peripheral circulation. Respiratory: Normal respiratory effort.  No retractions. Lungs CTAB. Abdominal: Soft and nontender. No distention. No guarding no rebound Back:  There is no focal tenderness or step off.  there is no midline tenderness there are no lesions noted. there is no CVA tenderness Musculoskeletal: No lower extremity tenderness, no upper extremity tenderness. No joint effusions, no DVT signs strong distal pulses no edema Neurologic:  Normal speech and language. No gross focal neurologic deficits are appreciated.  Skin:  Skin is warm, dry and intact.  Very superficial abrasion noted to the left knee, Psychiatric: Mood and affect are normal. Speech and behavior are normal.  ____________________________________________   LABS (all labs ordered  are listed, but only abnormal results are displayed)  Labs Reviewed - No data to display  Pertinent labs  results that were available during my care of the patient were reviewed by me and considered in my medical decision making (see chart for details). ____________________________________________  EKG  I personally interpreted any EKGs ordered by me or triage  ____________________________________________  RADIOLOGY  Pertinent labs & imaging results that were available during my care of the patient were reviewed by me and considered in my medical decision making (see chart for details). If possible, patient and/or family made aware of any abnormal findings.  Ct Head Wo Contrast  Result Date: 03/02/2018 CLINICAL  DATA:  Unwitnessed fall or falls.  Dementia. EXAM: CT HEAD WITHOUT CONTRAST TECHNIQUE: Contiguous axial images were obtained from the base of the skull through the vertex without intravenous contrast. COMPARISON:  02/27/2018. FINDINGS: Brain: No evidence for acute infarction, hemorrhage, mass lesion, hydrocephalus, or extra-axial fluid. Generalized atrophy. Extensive hypoattenuation of white matter, consistent with small vessel disease. Vascular: Calcification of the cavernous internal carotid arteries consistent with cerebrovascular atherosclerotic disease. No signs of intracranial large vessel occlusion. Skull: Calvarium intact. Sinuses/Orbits: No layering sinus fluid.  Negative orbits. Other: None. IMPRESSION: Atrophy and small vessel disease.  Intracranial atherosclerosis. No acute findings.  Stable appearance from priors. Electronically Signed   By: Elsie StainJohn T Curnes M.D.   On: 03/02/2018 18:07   Dg Knee Complete 4 Views Left  Result Date: 03/02/2018 CLINICAL DATA:  Nursing home patient.  Unwitnessed fall or falls. EXAM: LEFT KNEE - COMPLETE 4+ VIEW COMPARISON:  None. FINDINGS: Tricompartmental degenerative changes are relatively mild. There is no fracture or dislocation. There is no  effusion. Osteopenia is noted. There is vascular calcification. IMPRESSION: No acute findings. Degenerative change without effusion. Vascular calcification. Electronically Signed   By: Elsie StainJohn T Curnes M.D.   On: 03/02/2018 18:03   ____________________________________________    PROCEDURES  Procedure(s) performed: None  Procedures  Critical Care performed: None  ____________________________________________   INITIAL IMPRESSION / ASSESSMENT AND PLAN / ED COURSE  Pertinent labs & imaging results that were available during my care of the patient were reviewed by me and considered in my medical decision making (see chart for details).  Patient here with questionable fall, questionable head injury I do not see any evidence of acute injury to this time, however given concern from the caretaker did a CT which is negative.  Will be very difficult to tell clinically with this patient had a significant head injury given his baseline dementia.  Also there was concern about his left knee.  He denies any left knee pain to me I can fully range and I did not x-ray is negative I do not see any evidence of acute injury there is a very superficial abrasion to the knee of unclear duration.  No other pathology noted vital signs are reassuring we will discharge patient.    ____________________________________________   FINAL CLINICAL IMPRESSION(S) / ED DIAGNOSES  Final diagnoses:  None      This chart was dictated using voice recognition software.  Despite best efforts to proofread,  errors can occur which can change meaning.      Jeanmarie PlantMcShane, James A, MD 03/02/18 1843

## 2018-03-03 NOTE — Progress Notes (Signed)
ED Culture Results   Allergies: Nifedipine ER Visit Date: 02/27/18 Chief Complaint: UTI Culture Type: urine Culture Results: Proteus mirabilis Original Abx given: Cipro 500mg  PO bid x 10 days Original Abx sensitive, intermediate, or resistant: resistant Recommended Abx: Keflex 500mg  PO bid x 5 days ED Physician: Wyline CopasStafford Contacted Patient: patient is a resident at [facility]. Contacted facility with information. Prescription: copy of Rx faxed to [facility] and hard copy was mailed to facility.  Clovia CuffLisa Dahir Ayer, PharmD, BCPS 03/03/2018 6:34 PM

## 2018-10-19 ENCOUNTER — Emergency Department: Payer: Medicare Other

## 2018-10-19 ENCOUNTER — Emergency Department
Admission: EM | Admit: 2018-10-19 | Discharge: 2018-10-19 | Disposition: A | Discharge status: Home / Self Care | Payer: Medicare Other | Attending: Emergency Medicine | Admitting: Emergency Medicine

## 2018-10-19 DIAGNOSIS — S0181XA Laceration without foreign body of other part of head, initial encounter: Secondary | ICD-10-CM | POA: Diagnosis not present

## 2018-10-19 DIAGNOSIS — S065X9A Traumatic subdural hemorrhage with loss of consciousness of unspecified duration, initial encounter: Secondary | ICD-10-CM | POA: Diagnosis not present

## 2018-10-19 DIAGNOSIS — Y92129 Unspecified place in nursing home as the place of occurrence of the external cause: Secondary | ICD-10-CM | POA: Diagnosis not present

## 2018-10-19 DIAGNOSIS — Y999 Unspecified external cause status: Secondary | ICD-10-CM | POA: Diagnosis not present

## 2018-10-19 DIAGNOSIS — G309 Alzheimer's disease, unspecified: Secondary | ICD-10-CM | POA: Diagnosis not present

## 2018-10-19 DIAGNOSIS — I10 Essential (primary) hypertension: Secondary | ICD-10-CM | POA: Insufficient documentation

## 2018-10-19 DIAGNOSIS — F0280 Dementia in other diseases classified elsewhere without behavioral disturbance: Secondary | ICD-10-CM | POA: Insufficient documentation

## 2018-10-19 DIAGNOSIS — W19XXXA Unspecified fall, initial encounter: Secondary | ICD-10-CM | POA: Insufficient documentation

## 2018-10-19 DIAGNOSIS — S065XAA Traumatic subdural hemorrhage with loss of consciousness status unknown, initial encounter: Secondary | ICD-10-CM

## 2018-10-19 DIAGNOSIS — S060X1A Concussion with loss of consciousness of 30 minutes or less, initial encounter: Secondary | ICD-10-CM | POA: Diagnosis not present

## 2018-10-19 DIAGNOSIS — S0990XA Unspecified injury of head, initial encounter: Secondary | ICD-10-CM | POA: Diagnosis present

## 2018-10-19 DIAGNOSIS — Y9389 Activity, other specified: Secondary | ICD-10-CM | POA: Insufficient documentation

## 2018-10-19 DIAGNOSIS — S01311A Laceration without foreign body of right ear, initial encounter: Secondary | ICD-10-CM | POA: Diagnosis not present

## 2018-10-19 LAB — URINALYSIS, COMPLETE (UACMP) WITH MICROSCOPIC
Bilirubin Urine: NEGATIVE
Glucose, UA: NEGATIVE mg/dL
Ketones, ur: NEGATIVE mg/dL
Leukocytes,Ua: NEGATIVE
Nitrite: NEGATIVE
Protein, ur: NEGATIVE mg/dL
Specific Gravity, Urine: 1.008 (ref 1.005–1.030)
pH: 5 (ref 5.0–8.0)

## 2018-10-19 LAB — COMPREHENSIVE METABOLIC PANEL
ALT: 18 U/L (ref 0–44)
AST: 22 U/L (ref 15–41)
Albumin: 3.8 g/dL (ref 3.5–5.0)
Alkaline Phosphatase: 114 U/L (ref 38–126)
Anion gap: 8 (ref 5–15)
BUN: 28 mg/dL — ABNORMAL HIGH (ref 8–23)
CO2: 27 mmol/L (ref 22–32)
Calcium: 9.1 mg/dL (ref 8.9–10.3)
Chloride: 110 mmol/L (ref 98–111)
Creatinine, Ser: 1.9 mg/dL — ABNORMAL HIGH (ref 0.61–1.24)
GFR calc Af Amer: 36 mL/min — ABNORMAL LOW (ref >60)
GFR calc non Af Amer: 31 mL/min — ABNORMAL LOW (ref >60)
Glucose, Bld: 150 mg/dL — ABNORMAL HIGH (ref 70–99)
Potassium: 3.5 mmol/L (ref 3.5–5.1)
Sodium: 145 mmol/L (ref 135–145)
Total Bilirubin: 0.9 mg/dL (ref 0.3–1.2)
Total Protein: 7 g/dL (ref 6.5–8.1)

## 2018-10-19 LAB — CBC WITH DIFFERENTIAL/PLATELET
Abs Immature Granulocytes: 0.12 10*3/uL — ABNORMAL HIGH (ref 0.00–0.07)
Basophils Absolute: 0 10*3/uL (ref 0.0–0.1)
Basophils Relative: 0 %
Eosinophils Absolute: 0.2 10*3/uL (ref 0.0–0.5)
Eosinophils Relative: 2 %
HCT: 41.2 % (ref 39.0–52.0)
Hemoglobin: 13.3 g/dL (ref 13.0–17.0)
Immature Granulocytes: 1 %
Lymphocytes Relative: 19 %
Lymphs Abs: 1.7 10*3/uL (ref 0.7–4.0)
MCH: 30.9 pg (ref 26.0–34.0)
MCHC: 32.3 g/dL (ref 30.0–36.0)
MCV: 95.6 fL (ref 80.0–100.0)
Monocytes Absolute: 0.8 10*3/uL (ref 0.1–1.0)
Monocytes Relative: 9 %
Neutro Abs: 6.4 10*3/uL (ref 1.7–7.7)
Neutrophils Relative %: 69 %
Platelets: 190 10*3/uL (ref 150–400)
RBC: 4.31 MIL/uL (ref 4.22–5.81)
RDW: 14.5 % (ref 11.5–15.5)
WBC: 9.2 10*3/uL (ref 4.0–10.5)
nRBC: 0 % (ref 0.0–0.2)

## 2018-10-19 LAB — GLUCOSE, CAPILLARY: Glucose-Capillary: 126 mg/dL — ABNORMAL HIGH (ref 70–99)

## 2018-10-19 MED ORDER — SODIUM CHLORIDE 0.9 % IV BOLUS
500.0000 mL | Freq: Once | INTRAVENOUS | Status: AC
  Administered 2018-10-19: 13:00:00 500 mL via INTRAVENOUS

## 2018-10-19 MED ORDER — LIDOCAINE-EPINEPHRINE 1 %-1:100000 IJ SOLN
10.0000 mL | Freq: Once | INTRAMUSCULAR | Status: AC
  Administered 2018-10-19: 10 mL via INTRADERMAL
  Filled 2018-10-19: qty 10

## 2018-10-19 NOTE — ED Notes (Signed)
Son contacted and given update on dad and plan to send back to [facility] when able to give report. States dads normal speech ability is only word or so.

## 2018-10-19 NOTE — ED Provider Notes (Addendum)
-----------------------------------------   4:09 PM on 10/19/2018 -----------------------------------------  Was signed out to me at 315 this afternoon by Dr. Mayford Knife.  According to signout, patient with a small head bleed from a fall and a complicated lack which has been repaired.  Otherwise unremarkable work-up.  According to neurosurgery, patient is to have repeat CT scan and if there is no significant change she is to be discharged.  There is no further intervention needed.  Dr. Mayford Knife also discussed this plan with the patient's family and they are in agreement.  Dr. Mayford Knife did prepare discharge directions for this patient in the event that there is no significant change in the CT scan.  CT scan fortunately this time shows no change.  We are instructing them not to give him aspirin obviously, and he will be discharged according to neurosurgical plan.  Clinically no evidence of acute decompensation noted.  He is awake and alert, following commands, nonfocal.   Jeanmarie Plant, MD 10/19/18 1612    Jeanmarie Plant, MD 10/19/18 1659

## 2018-10-19 NOTE — ED Notes (Signed)
To [facility] with EMS with inst and chart copy.

## 2018-10-19 NOTE — ED Notes (Signed)
Dr. Mayford Knife in room to suture patient's lacerations to his face.  Will continue to monitor.

## 2018-10-19 NOTE — ED Notes (Signed)
Report to [place] at [facility]. Patient to be transferred by ambulance, cont awake, speaks few words.

## 2018-10-19 NOTE — Progress Notes (Signed)
Spoke with ED by phone regarding CT showing small right sided SDH. He only takes 81mg  ASA and no other high risk factors.Given small SDH and no neurologic effects, recommended repeat CT head in 6 hours, if stable can discharge and follow up in 2 weeks in clinic. Do not recommend seizure prophylaxis given small nature and risk of medication in this elderlygentleman outweighs benefit.

## 2018-10-19 NOTE — Discharge Instructions (Addendum)
Stop taking aspirin

## 2018-10-19 NOTE — ED Notes (Signed)
Patient resting in bed. Awake but weak. When questioned how he is whispers, "good", xeroform gauze and 2x2 over R brow suture and R ear suture.

## 2018-10-19 NOTE — ED Provider Notes (Signed)
[facility] Emergency Department Provider Note       Time seen: ----------------------------------------- 9:47 AM on 10/19/2018 ----------------------------------------- Level V caveat: History/ROS limited by altered mental status  I have reviewed the triage vital signs and the nursing notes.  HISTORY   Chief Complaint No chief complaint on file.    HPI Andrew Henderson is a 85 y.o. male with a history of dementia, hypertension, depression who presents to the ED for a fall that occurred today.  Patient reportedly ate breakfast, then got up from the table and subsequently fell.  He had a loss of consciousness for about a minute.  He was noted to have lacerations around his right eye and of his right ear.  He cannot give any further review of systems or report.  Past Medical History:  Diagnosis Date  . Alzheimer's dementia   . Enlarged prostate   . Hypertension   . Major depressive disorder     There are no active problems to display for this patient.   No past surgical history on file.  Allergies Nifedipine er  Social History Social History   Tobacco Use  . Smoking status: Unknown If Ever Smoked  . Smokeless tobacco: Never Used  Substance Use Topics  . Alcohol use: No    Frequency: Never  . Drug use: No   Review of Systems Positive for head injury, lacerations, loss of consciousness, fall  All systems negative/normal/unremarkable except as stated in the HPI  ____________________________________________   PHYSICAL EXAM:  VITAL SIGNS: ED Triage Vitals  Enc Vitals Group     BP      Pulse      Resp      Temp      Temp src      SpO2      Weight      Height      Head Circumference      Peak Flow      Pain Score      Pain Loc      Pain Edu?      Excl. in GC?    Constitutional: Alert but cannot assess orientation.  No obvious distress at this time Eyes: Conjunctivae are normal. Normal extraocular movements. ENT      Head:  Normocephalic, right periorbital lacerations are noted      Ears: Lacerations through the external pinnae, involving the cartilage on the right      Nose: No congestion/rhinnorhea.      Mouth/Throat: Mucous membranes are moist.      Neck: No stridor. Cardiovascular: Normal rate, regular rhythm. No murmurs, rubs, or gallops. Respiratory: Normal respiratory effort without tachypnea nor retractions. Breath sounds are clear and equal bilaterally. No wheezes/rales/rhonchi. Gastrointestinal: Soft and nontender. Normal bowel sounds Musculoskeletal: Nontender with normal range of motion in extremities. No lower extremity tenderness nor edema. Neurologic:  Normal speech and language. No gross focal neurologic deficits are appreciated.  Skin:  Skin is warm, dry and intact. No rash noted. Psychiatric: Mood and affect are normal. Speech and behavior are normal.  ____________________________________________  EKG: Interpreted by me.  Sinus rhythm the rate 88 bpm, PVCs, old inferior infarct, long QT  ____________________________________________  ED COURSE:  As part of my medical decision making, I reviewed the following data within the electronic MEDICAL RECORD NUMBER History obtained from family if available, nursing notes, old chart and ekg, as well as notes from prior ED visits. Patient presented for a fall with loss of consciousness, we will  assess with labs and imaging as indicated at this time.   Marland Kitchen..Laceration Repair Date/Time: 10/19/2018 1:34 PM Performed by: Emily FilbertWilliams, Alfie Rideaux E, MD Authorized by: Emily FilbertWilliams, Elloise Roark E, MD   Consent:    Consent obtained:  Emergent situation Anesthesia (see MAR for exact dosages):    Anesthesia method:  Local infiltration   Local anesthetic:  Lidocaine 1% WITH epi Laceration details:    Location:  Face   Face location:  R eyebrow   Length (cm):  5   Depth (mm):  10 Repair type:    Repair type:  Intermediate Exploration:    Hemostasis achieved with:  Direct  pressure   Wound exploration: entire depth of wound probed and visualized     Contaminated: no   Treatment:    Area cleansed with:  Saline   Amount of cleaning:  Extensive   Irrigation solution:  Sterile saline   Visualized foreign bodies/material removed: no   Skin repair:    Repair method:  Sutures   Suture size:  4-0   Suture material:  Prolene   Number of sutures:  8 Approximation:    Approximation:  Close Post-procedure details:    Dressing:  Sterile dressing   Patient tolerance of procedure:  Tolerated well, no immediate complications .Marland Kitchen.Laceration Repair Date/Time: 10/19/2018 1:34 PM Performed by: Emily FilbertWilliams, Ishaq Maffei E, MD Authorized by: Emily FilbertWilliams, Samuel Rittenhouse E, MD   Consent:    Consent obtained:  Emergent situation Anesthesia (see MAR for exact dosages):    Anesthesia method:  Local infiltration   Local anesthetic:  Lidocaine 1% WITH epi Laceration details:    Location:  Ear   Ear location:  R ear   Length (cm):  5   Depth (mm):  5 Repair type:    Repair type:  Complex Exploration:    Hemostasis achieved with:  Direct pressure   Wound exploration: entire depth of wound probed and visualized     Contaminated: no   Treatment:    Area cleansed with:  Saline   Amount of cleaning:  Extensive   Irrigation solution:  Sterile saline   Visualized foreign bodies/material removed: no   Skin repair:    Repair method:  Sutures   Suture size:  5-0   Suture material:  Fast-absorbing gut   Suture technique:  Running   Number of sutures:  10 Approximation:    Approximation:  Close Post-procedure details:    Dressing:  Sterile dressing   Patient tolerance of procedure:  Tolerated well, no immediate complications    Andrew Henderson was evaluated in Emergency Department on 10/19/2018 for the symptoms described in the history of present illness. He was evaluated in the context of the global COVID-19 pandemic, which necessitated consideration that the patient might be at risk for  infection with the SARS-CoV-2 virus that causes COVID-19. Institutional protocols and algorithms that pertain to the evaluation of patients at risk for COVID-19 are in a state of rapid change based on information released by regulatory bodies including the CDC and federal and state organizations. These policies and algorithms were followed during the patient's care in the ED.  ____________________________________________   LABS (pertinent positives/negatives)  Labs Reviewed  GLUCOSE, CAPILLARY - Abnormal; Notable for the following components:      Result Value   Glucose-Capillary 126 (*)    All other components within normal limits  CBC WITH DIFFERENTIAL/PLATELET - Abnormal; Notable for the following components:   Abs Immature Granulocytes 0.12 (*)    All other components within normal  limits  COMPREHENSIVE METABOLIC PANEL  URINALYSIS, COMPLETE (UACMP) WITH MICROSCOPIC  CBG MONITORING, ED   CRITICAL CARE Performed by: Ulice Dash   Total critical care time: 30 minutes  Critical care time was exclusive of separately billable procedures and treating other patients.  Critical care was necessary to treat or prevent imminent or life-threatening deterioration.  Critical care was time spent personally by me on the following activities: development of treatment plan with patient and/or surrogate as well as nursing, discussions with consultants, evaluation of patient's response to treatment, examination of patient, obtaining history from patient or surrogate, ordering and performing treatments and interventions, ordering and review of laboratory studies, ordering and review of radiographic studies, pulse oximetry and re-evaluation of patient's condition.  RADIOLOGY Images were viewed by me  CT head, chest x-ray IMPRESSION: 1. Low inspiratory volumes with perihilar and bibasilar atelectasis. 2. Cardiomegaly with mild vascular congestion but no overt pulmonary edema. 3.  Aortic  Atherosclerosis (ICD10-170.0) IMPRESSION: 1. Thin acute subdural hematoma over the right cerebral convexity. No mass effect. 2. Severe chronic small vessel ischemic disease. 3. Right periorbital hematoma. No acute maxillofacial or cervical spine fracture.  Critical Value/emergent results were called by telephone at the time of interpretation on 10/19/2018 at 10:45 am to Dr. Daryel November , who verbally acknowledged these results. ____________________________________________   DIFFERENTIAL DIAGNOSIS   Fall, concussion, subdural, fracture, laceration, arrhythmia  FINAL ASSESSMENT AND PLAN  Fall, head injury, extensive right periorbital and right ear lacerations, loss of consciousness, small subdural hematoma   Plan: The patient had presented for a fall that occurred today. Patient's labs were unremarkable other than mild elevations in BUN and creatinine for which she was given IV fluids. Patient's imaging did reveal a small subdural over the right cerebral convexity.  We have ordered a repeat CT scan 6 hours after the initial scan.  Wounds were repaired as dictated above.  He is pending disposition at this time.   Ulice Dash, MD    Note: This note was generated in part or whole with voice recognition software. Voice recognition is usually quite accurate but there are transcription errors that can and very often do occur. I apologize for any typographical errors that were not detected and corrected.     Emily Filbert, MD 10/19/18 [phone]

## 2018-10-19 NOTE — ED Notes (Signed)
Secretary arranging transport.

## 2018-10-19 NOTE — ED Notes (Signed)
[phone] goes to voice mail.

## 2018-10-19 NOTE — ED Notes (Signed)
Patient more awake. Looking around room. MAE weakly. Attempting to reach [facility] for report and tx back.

## 2018-10-19 NOTE — ED Notes (Signed)
Incontinent of urine. Diaper changed.

## 2018-10-19 NOTE — ED Triage Notes (Signed)
Patient presents to the ED via EMS post fall from [facility].  Patient is unresponsive with wide open mouth respirations that are even and not labored.  Pupils are pinpoint bilaterally.  Per EMS patient had breakfast at facility and stood up and then fell.  Patient has a laceration to his right eyebrow and right ear.  Patient has history of dementia but per staff at facility, normally walks and talks.

## 2018-10-21 ENCOUNTER — Emergency Department: Payer: Medicare Other

## 2018-10-21 ENCOUNTER — Observation Stay: Payer: Medicare Other

## 2018-10-21 ENCOUNTER — Observation Stay
Admission: EM | Admit: 2018-10-21 | Discharge: 2018-10-22 | Disposition: A | Discharge status: Home / Self Care | Payer: Medicare Other | Attending: Family Medicine | Admitting: Family Medicine

## 2018-10-21 ENCOUNTER — Encounter: Payer: Self-pay | Admitting: Emergency Medicine

## 2018-10-21 ENCOUNTER — Other Ambulatory Visit: Payer: Self-pay

## 2018-10-21 DIAGNOSIS — I10 Essential (primary) hypertension: Secondary | ICD-10-CM | POA: Insufficient documentation

## 2018-10-21 DIAGNOSIS — R9431 Abnormal electrocardiogram [ECG] [EKG]: Secondary | ICD-10-CM | POA: Insufficient documentation

## 2018-10-21 DIAGNOSIS — G9340 Encephalopathy, unspecified: Secondary | ICD-10-CM | POA: Diagnosis not present

## 2018-10-21 DIAGNOSIS — I6200 Nontraumatic subdural hemorrhage, unspecified: Secondary | ICD-10-CM | POA: Diagnosis not present

## 2018-10-21 DIAGNOSIS — E870 Hyperosmolality and hypernatremia: Principal | ICD-10-CM | POA: Insufficient documentation

## 2018-10-21 DIAGNOSIS — R4182 Altered mental status, unspecified: Secondary | ICD-10-CM | POA: Diagnosis present

## 2018-10-21 DIAGNOSIS — Z66 Do not resuscitate: Secondary | ICD-10-CM | POA: Diagnosis not present

## 2018-10-21 DIAGNOSIS — F0281 Dementia in other diseases classified elsewhere with behavioral disturbance: Secondary | ICD-10-CM | POA: Insufficient documentation

## 2018-10-21 DIAGNOSIS — Z793 Long term (current) use of hormonal contraceptives: Secondary | ICD-10-CM | POA: Insufficient documentation

## 2018-10-21 DIAGNOSIS — F329 Major depressive disorder, single episode, unspecified: Secondary | ICD-10-CM | POA: Diagnosis not present

## 2018-10-21 DIAGNOSIS — G309 Alzheimer's disease, unspecified: Secondary | ICD-10-CM | POA: Diagnosis not present

## 2018-10-21 DIAGNOSIS — N179 Acute kidney failure, unspecified: Secondary | ICD-10-CM | POA: Insufficient documentation

## 2018-10-21 DIAGNOSIS — N400 Enlarged prostate without lower urinary tract symptoms: Secondary | ICD-10-CM | POA: Diagnosis not present

## 2018-10-21 DIAGNOSIS — Z79899 Other long term (current) drug therapy: Secondary | ICD-10-CM | POA: Insufficient documentation

## 2018-10-21 DIAGNOSIS — Z7982 Long term (current) use of aspirin: Secondary | ICD-10-CM | POA: Insufficient documentation

## 2018-10-21 DIAGNOSIS — Z515 Encounter for palliative care: Secondary | ICD-10-CM | POA: Diagnosis not present

## 2018-10-21 DIAGNOSIS — Z7189 Other specified counseling: Secondary | ICD-10-CM | POA: Diagnosis not present

## 2018-10-21 LAB — CBC
HCT: 39.5 % (ref 39.0–52.0)
Hemoglobin: 12.3 g/dL — ABNORMAL LOW (ref 13.0–17.0)
MCH: 30.4 pg (ref 26.0–34.0)
MCHC: 31.1 g/dL (ref 30.0–36.0)
MCV: 97.8 fL (ref 80.0–100.0)
Platelets: 151 10*3/uL (ref 150–400)
RBC: 4.04 MIL/uL — ABNORMAL LOW (ref 4.22–5.81)
RDW: 14.6 % (ref 11.5–15.5)
WBC: 12.9 10*3/uL — ABNORMAL HIGH (ref 4.0–10.5)
nRBC: 0 % (ref 0.0–0.2)

## 2018-10-21 LAB — AMMONIA: Ammonia: 11 umol/L (ref 9–35)

## 2018-10-21 LAB — CBC WITH DIFFERENTIAL/PLATELET
Abs Immature Granulocytes: 0.05 10*3/uL (ref 0.00–0.07)
Basophils Absolute: 0 10*3/uL (ref 0.0–0.1)
Basophils Relative: 0 %
Eosinophils Absolute: 0.2 10*3/uL (ref 0.0–0.5)
Eosinophils Relative: 2 %
HCT: 40.4 % (ref 39.0–52.0)
Hemoglobin: 12.7 g/dL — ABNORMAL LOW (ref 13.0–17.0)
Immature Granulocytes: 0 %
Lymphocytes Relative: 20 %
Lymphs Abs: 2.5 10*3/uL (ref 0.7–4.0)
MCH: 30.5 pg (ref 26.0–34.0)
MCHC: 31.4 g/dL (ref 30.0–36.0)
MCV: 97.1 fL (ref 80.0–100.0)
Monocytes Absolute: 1.8 10*3/uL — ABNORMAL HIGH (ref 0.1–1.0)
Monocytes Relative: 15 %
Neutro Abs: 7.8 10*3/uL — ABNORMAL HIGH (ref 1.7–7.7)
Neutrophils Relative %: 63 %
Platelets: 164 10*3/uL (ref 150–400)
RBC: 4.16 MIL/uL — ABNORMAL LOW (ref 4.22–5.81)
RDW: 14.7 % (ref 11.5–15.5)
WBC: 12.5 10*3/uL — ABNORMAL HIGH (ref 4.0–10.5)
nRBC: 0 % (ref 0.0–0.2)

## 2018-10-21 LAB — URINALYSIS, COMPLETE (UACMP) WITH MICROSCOPIC
Bacteria, UA: NONE SEEN
Bilirubin Urine: NEGATIVE
Glucose, UA: NEGATIVE mg/dL
Hgb urine dipstick: NEGATIVE
Ketones, ur: NEGATIVE mg/dL
Leukocytes,Ua: NEGATIVE
Nitrite: NEGATIVE
Protein, ur: NEGATIVE mg/dL
Specific Gravity, Urine: 1.018 (ref 1.005–1.030)
pH: 5 (ref 5.0–8.0)

## 2018-10-21 LAB — COMPREHENSIVE METABOLIC PANEL
ALT: 15 U/L (ref 0–44)
AST: 17 U/L (ref 15–41)
Albumin: 3.4 g/dL — ABNORMAL LOW (ref 3.5–5.0)
Alkaline Phosphatase: 102 U/L (ref 38–126)
Anion gap: 9 (ref 5–15)
BUN: 33 mg/dL — ABNORMAL HIGH (ref 8–23)
CO2: 26 mmol/L (ref 22–32)
Calcium: 8.8 mg/dL — ABNORMAL LOW (ref 8.9–10.3)
Chloride: 114 mmol/L — ABNORMAL HIGH (ref 98–111)
Creatinine, Ser: 1.96 mg/dL — ABNORMAL HIGH (ref 0.61–1.24)
GFR calc Af Amer: 35 mL/min — ABNORMAL LOW (ref >60)
GFR calc non Af Amer: 30 mL/min — ABNORMAL LOW (ref >60)
Glucose, Bld: 106 mg/dL — ABNORMAL HIGH (ref 70–99)
Potassium: 3.8 mmol/L (ref 3.5–5.1)
Sodium: 149 mmol/L — ABNORMAL HIGH (ref 135–145)
Total Bilirubin: 1.1 mg/dL (ref 0.3–1.2)
Total Protein: 6.7 g/dL (ref 6.5–8.1)

## 2018-10-21 LAB — BASIC METABOLIC PANEL
Anion gap: 10 (ref 5–15)
BUN: 33 mg/dL — ABNORMAL HIGH (ref 8–23)
CO2: 24 mmol/L (ref 22–32)
Calcium: 8.6 mg/dL — ABNORMAL LOW (ref 8.9–10.3)
Chloride: 112 mmol/L — ABNORMAL HIGH (ref 98–111)
Creatinine, Ser: 1.74 mg/dL — ABNORMAL HIGH (ref 0.61–1.24)
GFR calc Af Amer: 41 mL/min — ABNORMAL LOW (ref >60)
GFR calc non Af Amer: 35 mL/min — ABNORMAL LOW (ref >60)
Glucose, Bld: 97 mg/dL (ref 70–99)
Potassium: 3.4 mmol/L — ABNORMAL LOW (ref 3.5–5.1)
Sodium: 146 mmol/L — ABNORMAL HIGH (ref 135–145)

## 2018-10-21 LAB — BLOOD GAS, VENOUS
Acid-Base Excess: 1.5 mmol/L (ref 0.0–2.0)
Bicarbonate: 27.7 mmol/L (ref 20.0–28.0)
O2 Saturation: 31.9 %
Patient temperature: 37
pCO2, Ven: 49 mmHg (ref 44.0–60.0)
pH, Ven: 7.36 (ref 7.250–7.430)
pO2, Ven: <31 mmHg — CL (ref 32.0–45.0)

## 2018-10-21 LAB — MRSA PCR SCREENING: MRSA by PCR: NEGATIVE

## 2018-10-21 LAB — TROPONIN I: Troponin I: <0.03 ng/mL (ref <0.03)

## 2018-10-21 LAB — MAGNESIUM: Magnesium: 2.3 mg/dL (ref 1.7–2.4)

## 2018-10-21 MED ORDER — MORPHINE SULFATE (PF) 2 MG/ML IV SOLN: 1.0000 mg | INTRAVENOUS | Status: DC | PRN

## 2018-10-21 MED ORDER — BIOTENE DRY MOUTH MT LIQD: 15.0000 mL | OROMUCOSAL | Status: DC | PRN

## 2018-10-21 MED ORDER — FINASTERIDE 5 MG PO TABS: 5.0000 mg | ORAL_TABLET | Freq: Every day | ORAL | Status: DC

## 2018-10-21 MED ORDER — GLYCOPYRROLATE 0.2 MG/ML IJ SOLN
0.2000 mg | INTRAMUSCULAR | Status: DC | PRN
  Filled 2018-10-21: qty 1

## 2018-10-21 MED ORDER — HALOPERIDOL LACTATE 5 MG/ML IJ SOLN: 0.5000 mg | INTRAMUSCULAR | Status: DC | PRN

## 2018-10-21 MED ORDER — SODIUM CHLORIDE 0.9 % IV BOLUS
500.0000 mL | Freq: Once | INTRAVENOUS | Status: AC
  Administered 2018-10-21: 500 mL via INTRAVENOUS

## 2018-10-21 MED ORDER — LORAZEPAM 2 MG/ML IJ SOLN: 1.0000 mg | INTRAMUSCULAR | Status: DC | PRN

## 2018-10-21 MED ORDER — POLYVINYL ALCOHOL 1.4 % OP SOLN
1.0000 [drp] | Freq: Four times a day (QID) | OPHTHALMIC | Status: DC | PRN
  Administered 2018-10-22: 07:00:00 1 [drp] via OPHTHALMIC
  Filled 2018-10-21 (×2): qty 15

## 2018-10-21 MED ORDER — LORAZEPAM 1 MG PO TABS: 1.0000 mg | ORAL_TABLET | ORAL | Status: DC | PRN

## 2018-10-21 MED ORDER — PAROXETINE HCL 20 MG PO TABS
40.0000 mg | ORAL_TABLET | Freq: Every day | ORAL | Status: DC
  Filled 2018-10-21: qty 2

## 2018-10-21 MED ORDER — DIPHENHYDRAMINE HCL 50 MG/ML IJ SOLN: 12.5000 mg | INTRAMUSCULAR | Status: DC | PRN

## 2018-10-21 MED ORDER — GLYCOPYRROLATE 1 MG PO TABS
1.0000 mg | ORAL_TABLET | ORAL | Status: DC | PRN
  Filled 2018-10-21: qty 1

## 2018-10-21 MED ORDER — TAMSULOSIN HCL 0.4 MG PO CAPS: 0.4000 mg | ORAL_CAPSULE | Freq: Every day | ORAL | Status: DC

## 2018-10-21 MED ORDER — ONDANSETRON HCL 4 MG PO TABS: 4.0000 mg | ORAL_TABLET | Freq: Four times a day (QID) | ORAL | Status: DC | PRN

## 2018-10-21 MED ORDER — POTASSIUM CHLORIDE 20 MEQ PO PACK: 40.0000 meq | PACK | Freq: Once | ORAL | Status: DC

## 2018-10-21 MED ORDER — NAPHAZOLINE-GLYCERIN 0.012-0.2 % OP SOLN: 1.0000 [drp] | Freq: Four times a day (QID) | OPHTHALMIC | Status: DC | PRN

## 2018-10-21 MED ORDER — SODIUM CHLORIDE 0.9 % IV SOLN: 2.0000 g | INTRAVENOUS | Status: DC

## 2018-10-21 MED ORDER — ACETAMINOPHEN 325 MG PO TABS: 650.0000 mg | ORAL_TABLET | Freq: Four times a day (QID) | ORAL | Status: DC | PRN

## 2018-10-21 MED ORDER — ENOXAPARIN SODIUM 40 MG/0.4ML ~~LOC~~ SOLN: 40.0000 mg | SUBCUTANEOUS | Status: DC

## 2018-10-21 MED ORDER — MORPHINE SULFATE (CONCENTRATE) 10 MG/0.5ML PO SOLN
5.0000 mg | ORAL | Status: DC | PRN
  Administered 2018-10-21: 5 mg via SUBLINGUAL
  Filled 2018-10-21: qty 1

## 2018-10-21 MED ORDER — LORAZEPAM 2 MG/ML PO CONC: 1.0000 mg | ORAL | Status: DC | PRN

## 2018-10-21 MED ORDER — SODIUM CHLORIDE 0.9 % IV SOLN: INTRAVENOUS | Status: DC

## 2018-10-21 MED ORDER — SODIUM CHLORIDE 0.9 % IV SOLN: 500.0000 mg | INTRAVENOUS | Status: DC

## 2018-10-21 MED ORDER — DONEPEZIL HCL 5 MG PO TABS: 10.0000 mg | ORAL_TABLET | Freq: Every day | ORAL | Status: DC

## 2018-10-21 MED ORDER — AMLODIPINE BESYLATE 5 MG PO TABS: 5.0000 mg | ORAL_TABLET | Freq: Every day | ORAL | Status: DC

## 2018-10-21 MED ORDER — SODIUM CHLORIDE 0.9% FLUSH: 3.0000 mL | INTRAVENOUS | Status: DC | PRN

## 2018-10-21 MED ORDER — TEMAZEPAM 7.5 MG PO CAPS: 7.5000 mg | ORAL_CAPSULE | Freq: Every evening | ORAL | Status: DC | PRN

## 2018-10-21 MED ORDER — VITAMIN D 25 MCG (1000 UNIT) PO TABS: 5000.0000 [IU] | ORAL_TABLET | Freq: Every day | ORAL | Status: DC

## 2018-10-21 MED ORDER — ASPIRIN EC 81 MG PO TBEC: 81.0000 mg | DELAYED_RELEASE_TABLET | Freq: Every day | ORAL | Status: DC

## 2018-10-21 MED ORDER — HYDRALAZINE HCL 20 MG/ML IJ SOLN: 10.0000 mg | INTRAMUSCULAR | Status: DC | PRN

## 2018-10-21 MED ORDER — SODIUM CHLORIDE 0.45 % IV SOLN: INTRAVENOUS | Status: DC

## 2018-10-21 MED ORDER — TRAZODONE HCL 50 MG PO TABS: 25.0000 mg | ORAL_TABLET | Freq: Every evening | ORAL | Status: DC | PRN

## 2018-10-21 MED ORDER — HALOPERIDOL 0.5 MG PO TABS
0.5000 mg | ORAL_TABLET | ORAL | Status: DC | PRN
  Filled 2018-10-21: qty 1

## 2018-10-21 MED ORDER — ACETAMINOPHEN 650 MG RE SUPP: 650.0000 mg | Freq: Four times a day (QID) | RECTAL | Status: DC | PRN

## 2018-10-21 MED ORDER — HALOPERIDOL LACTATE 2 MG/ML PO CONC
0.5000 mg | ORAL | Status: DC | PRN
  Filled 2018-10-21: qty 0.3

## 2018-10-21 MED ORDER — ONDANSETRON 4 MG PO TBDP: 4.0000 mg | ORAL_TABLET | Freq: Four times a day (QID) | ORAL | Status: DC | PRN

## 2018-10-21 MED ORDER — MAGNESIUM HYDROXIDE 400 MG/5ML PO SUSP
30.0000 mL | Freq: Every evening | ORAL | Status: DC | PRN
  Filled 2018-10-21: qty 30

## 2018-10-21 MED ORDER — MORPHINE SULFATE (CONCENTRATE) 10 MG/0.5ML PO SOLN: 5.0000 mg | ORAL | Status: DC | PRN

## 2018-10-21 MED ORDER — ALUM & MAG HYDROXIDE-SIMETH 200-200-20 MG/5ML PO SUSP: 30.0000 mL | ORAL | Status: DC | PRN

## 2018-10-21 MED ORDER — SODIUM CHLORIDE 0.9 % IV SOLN
12.5000 mg | Freq: Four times a day (QID) | INTRAVENOUS | Status: DC | PRN
  Filled 2018-10-21: qty 0.5

## 2018-10-21 MED ORDER — SODIUM CHLORIDE 0.9% FLUSH: 3.0000 mL | Freq: Two times a day (BID) | INTRAVENOUS | Status: DC

## 2018-10-21 MED ORDER — PANTOPRAZOLE SODIUM 40 MG IV SOLR
40.0000 mg | INTRAVENOUS | Status: DC
  Administered 2018-10-21: 40 mg via INTRAVENOUS
  Filled 2018-10-21: qty 40

## 2018-10-21 MED ORDER — ONDANSETRON HCL 4 MG/2ML IJ SOLN: 4.0000 mg | Freq: Four times a day (QID) | INTRAMUSCULAR | Status: DC | PRN

## 2018-10-21 MED ORDER — DEXTROSE 5 % IV SOLN
INTRAVENOUS | Status: DC
  Administered 2018-10-21: 09:00:00 via INTRAVENOUS

## 2018-10-21 MED ORDER — MELATONIN 5 MG PO TABS: 5.0000 mg | ORAL_TABLET | Freq: Every day | ORAL | Status: DC

## 2018-10-21 MED ORDER — MIRABEGRON ER 25 MG PO TB24
25.0000 mg | ORAL_TABLET | Freq: Every day | ORAL | Status: DC
  Filled 2018-10-21: qty 1

## 2018-10-21 MED ORDER — SODIUM CHLORIDE 0.9 % IV SOLN: 250.0000 mL | INTRAVENOUS | Status: DC | PRN

## 2018-10-21 MED ORDER — POLYVINYL ALCOHOL 1.4 % OP SOLN
1.0000 [drp] | Freq: Four times a day (QID) | OPHTHALMIC | Status: DC | PRN
  Filled 2018-10-21: qty 15

## 2018-10-21 MED ORDER — GUAIFENESIN 100 MG/5ML PO SOLN: 200.0000 mg | Freq: Four times a day (QID) | ORAL | Status: DC | PRN

## 2018-10-21 MED ORDER — PANTOPRAZOLE SODIUM 40 MG PO TBEC: 40.0000 mg | DELAYED_RELEASE_TABLET | Freq: Every day | ORAL | Status: DC

## 2018-10-21 MED ORDER — LORATADINE 10 MG PO TABS: 10.0000 mg | ORAL_TABLET | Freq: Every day | ORAL | Status: DC

## 2018-10-21 NOTE — TOC Initial Note (Signed)
Transition of Care [facility](TOC) - Initial/Assessment Note    Patient Details  Name: Andrew Henderson MRN: [id number] Date of Birth: September 03, 1931  Transition of Care [facility](TOC) CM/SW Contact:    Ruthe Mannanandace  Kellis Mcadam, LCSWA Phone Number: 10/21/2018, 3:55 PM  Clinical Narrative:  CSW received consult for residential hospice placement. CSW spoke with patient's son Andrew Henderson [phone]. Son states that patient was living at [facility] prior to hospitalization. Son reports that he and family are interested in hospice home after speaking with Palliative team. [place] between he and his sister. CSW notified Clydie BraunKaren with [facility] of referral. CSW will continue to follow for discharge planning.                   Expected Discharge Plan: Hospice Medical Facility Barriers to Discharge: Hospice Bed not available   Patient Goals and CMS Choice Patient states their goals for this hospitalization and ongoing recovery are:: Son states that his primary concern is comfort  CMS Medicare.gov Compare Post Acute Care list provided to:: Patient Represenative (must comment)(Son- Andrew GibsonMarvin Romney ) Choice offered to / list presented to : Adult Children  Expected Discharge Plan and Services Expected Discharge Plan: Hospice Medical Facility In-house Referral: Hospice / Palliative Care   Post Acute Care Choice: Hospice Living arrangements for the past 2 months: Assisted Living Facility                          Prior Living Arrangements/Services Living arrangements for the past 2 months: Assisted Living Facility Lives with:: Facility Resident Patient language and need for interpreter reviewed:: Yes Do you feel safe going back to the place where you live?: Yes      Need for Family Participation in Patient Care: Yes (Comment) Care giver support system in place?: Yes (comment)   Criminal Activity/Legal Involvement Pertinent to Current  Situation/Hospitalization: No - Comment as needed  Activities of Daily Living Home Assistive Devices/Equipment: None ADL Screening (condition at time of admission) Patient's cognitive ability adequate to safely complete daily activities?: No Is the patient deaf or have difficulty hearing?: No Does the patient have difficulty seeing, even when wearing glasses/contacts?: No Does the patient have difficulty concentrating, remembering, or making decisions?: Yes Patient able to express need for assistance with ADLs?: No Does the patient have difficulty dressing or bathing?: Yes Independently performs ADLs?: No Communication: Dependent Is this a change from baseline?: Pre-admission baseline Dressing (OT): Dependent Is this a change from baseline?: Pre-admission baseline Grooming: Dependent Is this a change from baseline?: Pre-admission baseline Feeding: Dependent Is this a change from baseline?: Pre-admission baseline Bathing: Dependent Is this a change from baseline?: Pre-admission baseline Toileting: Dependent Is this a change from baseline?: Pre-admission baseline In/Out Bed: Dependent Is this a change from baseline?: Pre-admission baseline Walks in Home: Dependent Is this a change from baseline?: Pre-admission baseline Does the patient have difficulty walking or climbing stairs?: Yes Weakness of Legs: Both Weakness of Arms/Hands: Both  Permission Sought/Granted Permission sought to share information with : Case Manager, Magazine features editoracility Contact Representative, Family Supports Permission granted to share information with : Yes, Verbal Permission Granted              Emotional Assessment Appearance:: Appears stated age       Alcohol / Substance Use: Not Applicable Psych Involvement: No (comment)  Admission diagnosis:  Hypernatremia [E87.0] Altered mental status, unspecified altered mental status type [  R41.82] Patient Active Problem List   Diagnosis Date Noted  . Encephalopathy  acute 10/21/2018   PCP:  Patient, No Pcp Per Pharmacy:  No Pharmacies Listed    Social Determinants of Health (SDOH) Interventions    Readmission Risk Interventions No flowsheet data found.

## 2018-10-21 NOTE — Progress Notes (Signed)
SLP Cancellation Note  Patient Details Name: Andrew Henderson MRN: [id number] DOB: 1931-11-10   Cancelled treatment:       Reason Eval/Treat Not Completed: Fatigue/lethargy limiting ability to participate;Patient not medically ready(chart reviewed; consulted NSG re: pt's status currently). Pt is poorly responsive to verbal/tactile stim and not able to maintain sufficient alertness to be safe for po's today. Suspect impact from acute encephalopathy; Dementia; dehydration. ST services will f/u tomorrow for appropriateness for BSE and oral intake. Recommend frequent oral care when/if awake; aspiration precautions.    Jerilynn Som, MS, CCC-SLP Watson,Katherine 10/21/2018, 3:47 PM

## 2018-10-21 NOTE — Consult Note (Signed)
Requesting Physician: Dr. Katheren Shams    Chief Complaint: Altered mental status,   History obtained from: Patient and Chart    HPI:                                                                                                                                       Andrew Henderson is an 85 y.o. male with PMH of advanced dementia, HTN, Major depression presented to [facility] ER after sudden onset of altered mental status. Had recent fall resulting in right subdural hematoma.  ER workup included head CT which showed stable size of hematoma. Labs were remarkable for elevated NA of 149 and creatinine. Patient also hypoxic, improved with 97% room air.  Patient admitted for encephalopathy and received IV fluids. Remained encephalopathic and MRI brain performed shows punctate area of restriction diffusion close to right parietal hemorrhage and Neurology consulted for possible acute stroke.  Patient at baseline used to walk with walker/wheelchair for mobility. Palliative consulted as well and family decided patient has poor QAL and patient made hospice       Past Medical History:  Diagnosis Date  . Alzheimer's dementia (HCC)   . Enlarged prostate   . Hypertension   . Major depressive disorder     History reviewed. No pertinent surgical history.  History reviewed. No pertinent family history. Social History:  reports that he has never smoked. He has never used smokeless tobacco. He reports that he does not drink alcohol or use drugs.  Allergies:  Allergies  Allergen Reactions  . Nifedipine Er     Medications:                                                                                                                        I reviewed home medications   ROS:  14 systems reviewed and negative except above    Examination:                                                                                                       General: Appears well-developed and well-nourished.  Psych: confused  Eyes: Periorbital hematoma around R eye  HENT: No OP obstrucion Head: Normocephalic.  Cardiovascular: Normal rate and regular rhythm.  Respiratory: Effort normal and breath sounds normal to anterior ascultation GI: Soft.  No distension. There is no tenderness.  Skin: WDI    Neurological Examination Mental Status: Alert, oriented, thought content appropriate.  Speech fluent without evidence of aphasia. Able to follow 3 step commands without difficulty. Cranial Nerves: II: Visual fields grossly normal,  III,IV, VI: ptosis not present, extra-ocular motions intact bilaterally, pupils equal, round, reactive to light and accommodation V,VII: smile symmetric, facial light touch sensation normal bilaterally VIII: hearing normal bilaterally IX,X: uvula rises symmetrically XI: bilateral shoulder shrug XII: midline tongue extension Motor: Right : Upper extremity   5/5    Left:     Upper extremity   5/5  Lower extremity   5/5     Lower extremity   5/5 Tone and bulk:normal tone throughout; no atrophy noted Sensory: Pinprick and light touch intact throughout, bilaterally Deep Tendon Reflexes: 2+ and symmetric throughout Plantars: Right: downgoing   Left: downgoing Cerebellar: normal finger-to-nose, normal rapid alternating movements and normal heel-to-shin test Gait: normal gait and station     Lab Results: Basic Metabolic Panel: Recent Labs  Lab 10/19/18 0958 10/21/18 0128 10/21/18 0641  NA 145 149* 146*  K 3.5 3.8 3.4*  CL 110 114* 112*  CO2 27 26 24   GLUCOSE 150* 106* 97  BUN 28* 33* 33*  CREATININE 1.90* 1.96* 1.74*  CALCIUM 9.1 8.8* 8.6*  MG  --   --  2.3    CBC: Recent Labs  Lab 10/19/18 0958 10/21/18 0129 10/21/18 0641  WBC 9.2 12.5* 12.9*  NEUTROABS 6.4 7.8*  --   HGB 13.3 12.7* 12.3*  HCT 41.2 40.4 39.5  MCV 95.6 97.1 97.8  PLT  190 164 151    Coagulation Studies: No results for input(s): LABPROT, INR in the last 72 hours.  Imaging: Ct Head Wo Contrast  Result Date: 10/21/2018 CLINICAL DATA:  Follow-up subdural hematoma EXAM: CT HEAD WITHOUT CONTRAST TECHNIQUE: Contiguous axial images were obtained from the base of the skull through the vertex without intravenous contrast. COMPARISON:  10/19/2018 FINDINGS: Brain: Diffuse atrophic changes and chronic white matter ischemic changes are again identified. The previously seen right-sided subdural hematoma is less well appreciated on today's exam. No new areas of hemorrhage are seen. No acute infarction is noted. Vascular: No hyperdense vessel or unexpected calcification. Skull: Normal. Negative for fracture or focal lesion. Sinuses/Orbits: No acute finding. Other: None. IMPRESSION: Previously seen right-sided subdural hematoma is not well appreciated on today's exam. Chronic atrophic and ischemic changes stable from the previous study. No new focal abnormality is noted. Electronically Signed   By: Alcide Clever M.D.   On: 10/21/2018 02:32  Mr Brain Wo Contrast  Result Date: 10/21/2018 CLINICAL DATA:  Patient found down with continued unresponsiveness. EXAM: MRI HEAD WITHOUT CONTRAST TECHNIQUE: Multiplanar, multiecho pulse sequences of the brain and surrounding structures were obtained without intravenous contrast. COMPARISON:  Head CT 10/21/2018 FINDINGS: BRAIN: There is a focus of abnormal diffusion restriction within the peripheral right parietal lobe (2:42) measuring approximately 8 mm. There is a small amount of overlying extra-axial blood, at the site of previously identified subdural hematoma, measuring approximately 2 mm. The midline structures are normal. Multiple old small vessel infarcts including the right thalamus and the cerebellum. Diffuse confluent hyperintense T2-weighted signal within the periventricular, deep and juxtacortical white matter, most commonly due to  chronic ischemic microangiopathy. There is diffuse advanced atrophy. There is chronic microhemorrhage evidence in both cerebellar hemispheres and the right thalamus. There is magnetic susceptibility effect within the subdural space at the posterior right convexity. No mass lesion. VASCULAR: The major intracranial arterial and venous sinus flow voids are normal. SKULL AND UPPER CERVICAL SPINE: Calvarial bone marrow signal is normal. There is no skull base mass. Visualized upper cervical spine and soft tissues are normal. SINUSES/ORBITS: No fluid levels or advanced mucosal thickening. No mastoid or middle ear effusion. The orbits are normal. IMPRESSION: 1. Small ductus of acute ischemia within the posterior right parietal lobe. 2. Small amount of residual subdural blood at the right posterior convexity. 3. Advanced atrophy and findings of chronic ischemic microangiopathy. Electronically Signed   By: Deatra RobinsonKevin  Herman M.D.   On: 10/21/2018 13:43   Dg Chest Portable 1 View  Result Date: 10/21/2018 CLINICAL DATA:  Shortness of breath EXAM: PORTABLE CHEST 1 VIEW COMPARISON:  10/19/2018 FINDINGS: Cardiac shadow is enlarged. Aortic calcifications are noted. Lungs are well aerated bilaterally. No focal infiltrate or sizable effusion is seen. Degenerative changes of the thoracic spine are noted. IMPRESSION: No acute abnormality noted. Electronically Signed   By: Alcide CleverMark  Lukens M.D.   On: 10/21/2018 02:26     I have reviewed the above imaging : CT head and MRI Brain    ASSESSMENT AND PLAN  85 y.o. male with PMH of advanced dementia, HTN, Major depression presented to [facility] ER after sudden onset of altered mental status. Had a recent fall resulting in right subdural hematoma.   Area of restriction diffusion in R parietal cortex, right next to SDH, likely representative of hemosiderin vs T2 shine through, less likely infarct. Not consistent with presentation to suggest acute stroke.   Encephalopathy- multifactorial  in patient with underlying dementia Subacute Right parietal SDH Punctate area of  restriction diffusion- may represent blood product, T2 shine through vs infarct   Recommendations Patient made comfort care and being transferred to hospice, which is reasonable given poor QAL prior to admission. If family changes their mind, would recommend  Routine EEG to rule of subclinical seizures Continue Aricept, hold Risperdal and antidepressants Continue to IV fluids, address metabolic issues  Hold ASA 81mg  due to SDH  Hendricks Schwandt [facility] Pager Number [phone]

## 2018-10-21 NOTE — Progress Notes (Signed)
Post Abg  fio2 to 75% rate 18 bpm , per Dr.robionson , vent is plugged into red outlet  .

## 2018-10-21 NOTE — ED Triage Notes (Signed)
Pt arrived via EMS from [facility] where night shift found pt unresponsive when entering room. Staff at facility reported pt fell a few days ago," and was not evaluated. Pt has dried blood as well as stitches to the right ear lobe and above right eye. Pt is not responsive to painful stimuli. Pt with mouth gapped open, non-re breather in place. MD at bedside.

## 2018-10-21 NOTE — ED Notes (Signed)
ED TO INPATIENT HANDOFF REPORT  ED Nurse Name and Phone #: Erie Noe [phone]  S Name/Age/Gender Andrew Henderson 85 y.o. male Room/Bed: ED11A/ED11A  Code Status   Code Status: Full Code  Home/SNF/Other Nursing Home Patient oriented to: self Is this baseline? No   Triage Complete: Triage complete  Chief Complaint [facility] Unresponsive  Triage Note Pt arrived via EMS from [facility] where night shift found pt unresponsive when entering room. Staff at facility reported pt fell a few days ago," and was not evaluated. Pt has dried blood as well as stitches to the right ear lobe and above right eye. Pt is not responsive to painful stimuli. Pt with mouth gapped open, non-re breather in place. MD at bedside.    Allergies Allergies  Allergen Reactions  . Nifedipine Er     Level of Care/Admitting Diagnosis ED Disposition    ED Disposition Condition Comment   Admit  Hospital Area: [facility] [[id number]]  Level of Care: Med-Surg [16]  Diagnosis: Encephalopathy acute [247508]  Admitting Physician: Hannah Beat [[id number]]  Attending Physician: Hannah Beat [[id number]]  PT Class (Do Not Modify): Observation [104]  PT Acc Code (Do Not Modify): Observation [10022]       B Medical/Surgery History Past Medical History:  Diagnosis Date  . Alzheimer's dementia (HCC)   . Enlarged prostate   . Hypertension   . Major depressive disorder    History reviewed. No pertinent surgical history.   A IV Location/Drains/Wounds Patient Lines/Drains/Airways Status   Active Line/Drains/Airways    Name:   Placement date:   Placement time:   Site:   Days:   Peripheral IV 10/21/18 Left Hand   10/21/18    0126    Hand   less than 1   Peripheral IV 10/21/18 Right Forearm   10/21/18    0142    Forearm   less than 1          Intake/Output Last 24 hours No intake or output data in the 24 hours ending 10/21/18 0532  Labs/Imaging Results for orders placed or performed during the  hospital encounter of 10/21/18 (from the past 48 hour(s))  Comprehensive metabolic panel     Status: Abnormal   Collection Time: 10/21/18  1:28 AM  Result Value Ref Range   Sodium 149 (H) 135 - 145 mmol/L   Potassium 3.8 3.5 - 5.1 mmol/L   Chloride 114 (H) 98 - 111 mmol/L   CO2 26 22 - 32 mmol/L   Glucose, Bld 106 (H) 70 - 99 mg/dL   BUN 33 (H) 8 - 23 mg/dL   Creatinine, Ser 4.25 (H) 0.61 - 1.24 mg/dL   Calcium 8.8 (L) 8.9 - 10.3 mg/dL   Total Protein 6.7 6.5 - 8.1 g/dL   Albumin 3.4 (L) 3.5 - 5.0 g/dL   AST 17 15 - 41 U/L   ALT 15 0 - 44 U/L   Alkaline Phosphatase 102 38 - 126 U/L   Total Bilirubin 1.1 0.3 - 1.2 mg/dL   GFR calc non Af Amer 30 (L) >60 mL/min   GFR calc Af Amer 35 (L) >60 mL/min   Anion gap 9 5 - 15    Comment: Performed at [facility], [address] [place], [place] [address]  CBC with Differential/Platelet     Status: Abnormal   Collection Time: 10/21/18  1:29 AM  Result Value Ref Range   WBC 12.5 (H) 4.0 - 10.5 K/uL   RBC  4.16 (L) 4.22 - 5.81 MIL/uL   Hemoglobin 12.7 (L) 13.0 - 17.0 g/dL   HCT 16.1 09.6 - 04.5 %   MCV 97.1 80.0 - 100.0 fL   MCH 30.5 26.0 - 34.0 pg   MCHC 31.4 30.0 - 36.0 g/dL   RDW 40.9 81.1 - 91.4 %   Platelets 164 150 - 400 K/uL   nRBC 0.0 0.0 - 0.2 %   Neutrophils Relative % 63 %   Neutro Abs 7.8 (H) 1.7 - 7.7 K/uL   Lymphocytes Relative 20 %   Lymphs Abs 2.5 0.7 - 4.0 K/uL   Monocytes Relative 15 %   Monocytes Absolute 1.8 (H) 0.1 - 1.0 K/uL   Eosinophils Relative 2 %   Eosinophils Absolute 0.2 0.0 - 0.5 K/uL   Basophils Relative 0 %   Basophils Absolute 0.0 0.0 - 0.1 K/uL   Immature Granulocytes 0 %   Abs Immature Granulocytes 0.05 0.00 - 0.07 K/uL    Comment: Performed at [facility], [address] [place], [place] [address]  Blood gas, venous     Status: Abnormal   Collection Time: 10/21/18  1:29 AM  Result Value Ref Range   pH, Ven 7.36 7.250 - 7.430   pCO2, Ven 49 44.0 - 60.0 mmHg   pO2, Ven  <31.0 (LL) 32.0 - 45.0 mmHg    Comment: CRITICAL RESULT CALLED TO, READ BACK BY AND VERIFIED WITH: ANDREA,BRYNT RN AT 0155 ON [id number] BY SMATHEW RRT    Bicarbonate 27.7 20.0 - 28.0 mmol/L   Acid-Base Excess 1.5 0.0 - 2.0 mmol/L   O2 Saturation 31.9 %   Patient temperature 37.0    Collection site VENOUS    Sample type VENOUS     Comment: Performed at [facility], [address] [place], [place] [address]  Urinalysis, Complete w Microscopic     Status: Abnormal   Collection Time: 10/21/18  1:29 AM  Result Value Ref Range   Color, Urine YELLOW (A) YELLOW   APPearance HAZY (A) CLEAR   Specific Gravity, Urine 1.018 1.005 - 1.030   pH 5.0 5.0 - 8.0   Glucose, UA NEGATIVE NEGATIVE mg/dL   Hgb urine dipstick NEGATIVE NEGATIVE   Bilirubin Urine NEGATIVE NEGATIVE   Ketones, ur NEGATIVE NEGATIVE mg/dL   Protein, ur NEGATIVE NEGATIVE mg/dL   Nitrite NEGATIVE NEGATIVE   Leukocytes,Ua NEGATIVE NEGATIVE   WBC, UA 0-5 0 - 5 WBC/hpf   Bacteria, UA NONE SEEN NONE SEEN   Squamous Epithelial / LPF 0-5 0 - 5   Mucus PRESENT    Amorphous Crystal PRESENT     Comment: Performed at [facility], [address] [place], [place] [address]  Troponin I - Add-On to previous collection     Status: None   Collection Time: 10/21/18  1:29 AM  Result Value Ref Range   Troponin I <0.03 <0.03 ng/mL    Comment: Performed at [facility], [address], [place], [place] [address]   Dg Chest 1 View  Result Date: 10/19/2018 CLINICAL DATA:  85 year old male with altered mental status after falling EXAM: CHEST  1 VIEW COMPARISON:  Prior chest x-ray 02/27/2018 FINDINGS: Stable cardiac and mediastinal contours. Mild cardiomegaly. Atherosclerotic calcifications are present in the transverse aorta. Inspiratory volumes are low. There is mild pulmonary vascular congestion without overt edema. Streaky bibasilar airspace opacities are favored to reflect atelectasis. No acute osseous  abnormality. 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 IMPRESSION: 1.  Low inspiratory volumes with perihilar and bibasilar atelectasis. 2. Cardiomegaly with mild vascular congestion but no overt pulmonary edema. 3.  Aortic Atherosclerosis (ICD10-170.0) Electronically Signed   By: Malachy Moan M.D.   On: 10/19/2018 10:09   Ct Head Wo Contrast  Result Date: 10/21/2018 CLINICAL DATA:  Follow-up subdural hematoma EXAM: CT HEAD WITHOUT CONTRAST TECHNIQUE: Contiguous axial images were obtained from the base of the skull through the vertex without intravenous contrast. COMPARISON:  10/19/2018 FINDINGS: Brain: Diffuse atrophic changes and chronic white matter ischemic changes are again identified. The previously seen right-sided subdural hematoma is less well appreciated on today's exam. No new areas of hemorrhage are seen. No acute infarction is noted. Vascular: No hyperdense vessel or unexpected calcification. Skull: Normal. Negative for fracture or focal lesion. Sinuses/Orbits: No acute finding. Other: None. IMPRESSION: Previously seen right-sided subdural hematoma is not well appreciated on today's exam. Chronic atrophic and ischemic changes stable from the previous study. No new focal abnormality is noted. Electronically Signed   By: Alcide Clever M.D.   On: 10/21/2018 02:32   Ct Head Wo Contrast  Result Date: 10/19/2018 CLINICAL DATA:  85 year old male for follow-up of subdural hematoma. EXAM: CT HEAD WITHOUT CONTRAST TECHNIQUE: Contiguous axial images were obtained from the base of the skull through the vertex without intravenous contrast. COMPARISON:  None. FINDINGS: Brain: The small subpleural hematoma overlying the RIGHT cerebral convexity is unchanged measuring 2.6 mm in greatest diameter. No new hemorrhage or midline shift identified. Atrophy and moderate to severe chronic small-vessel white matter ischemic changes again noted. Ischemic changes within the basal  ganglia/thalami again noted. Vascular: Heavy atherosclerotic calcifications again noted. Skull: No acute abnormality Sinuses/Orbits: No acute abnormality Other: None IMPRESSION: Unchanged 2.6 mm RIGHT subdural hematoma.  No new findings. Atrophy and chronic small-vessel ischemic changes. Electronically Signed   By: Harmon Pier M.D.   On: 10/19/2018 15:50   Ct Head Wo Contrast  Result Date: 10/19/2018 CLINICAL DATA:  Fall. Unresponsive. History of dementia. EXAM: CT HEAD WITHOUT CONTRAST CT MAXILLOFACIAL WITHOUT CONTRAST CT CERVICAL SPINE WITHOUT CONTRAST TECHNIQUE: Multidetector CT imaging of the head, cervical spine, and maxillofacial structures were performed using the standard protocol without intravenous contrast. Multiplanar CT image reconstructions of the cervical spine and maxillofacial structures were also generated. COMPARISON:  Head CT 03/02/2018. Cervical spine CT 10/05/2017. FINDINGS: CT HEAD FINDINGS Brain: A thin acute subdural hematoma over the right cerebral convexity measures up to 3 mm in thickness. There is no midline shift or other associated mass effect. No acute large territory infarct is identified. Confluent cerebral white matter hypodensities and patchy hypodensity in the basal ganglia and thalami are similar to the prior study and nonspecific but compatible with severe chronic small vessel ischemic disease. There is moderate cerebral atrophy. Vascular: Intracranial arterial dolichoectasia and extensive calcific atherosclerosis. Skull: No fracture. Other: Right periorbital hematoma. CT MAXILLOFACIAL FINDINGS Osseous: No acute fracture, mandibular dislocation, or destructive osseous process. Edentulous. Orbits: Right periorbital hematoma. No retro-orbital hematoma. Bilateral cataract extraction. Sinuses: Paranasal sinuses and mastoid air cells are clear. Soft tissues: No additional findings. CT CERVICAL SPINE FINDINGS Alignment: Cervical spine straightening. No listhesis. Skull base and  vertebrae: No acute fracture or destructive osseous process. Soft tissues and spinal canal: No prevertebral fluid or swelling. No visible canal hematoma. Disc levels: Degenerative ankylosis across the C5-6 and C6-7 disc spaces. Mild disc space narrowing at C4-5. Anterior vertebral osteophyte formation extending from C4 into the thoracic spine. Upper chest: Clear lung apices. Other: None. IMPRESSION:  1. Thin acute subdural hematoma over the right cerebral convexity. No mass effect. 2. Severe chronic small vessel ischemic disease. 3. Right periorbital hematoma. No acute maxillofacial or cervical spine fracture. Critical Value/emergent results were called by telephone at the time of interpretation on 10/19/2018 at 10:45 am to Dr. Daryel November , who verbally acknowledged these results. Electronically Signed   By: Sebastian Ache M.D.   On: 10/19/2018 10:45   Ct Cervical Spine Wo Contrast  Result Date: 10/19/2018 CLINICAL DATA:  Fall. Unresponsive. History of dementia. EXAM: CT HEAD WITHOUT CONTRAST CT MAXILLOFACIAL WITHOUT CONTRAST CT CERVICAL SPINE WITHOUT CONTRAST TECHNIQUE: Multidetector CT imaging of the head, cervical spine, and maxillofacial structures were performed using the standard protocol without intravenous contrast. Multiplanar CT image reconstructions of the cervical spine and maxillofacial structures were also generated. COMPARISON:  Head CT 03/02/2018. Cervical spine CT 10/05/2017. FINDINGS: CT HEAD FINDINGS Brain: A thin acute subdural hematoma over the right cerebral convexity measures up to 3 mm in thickness. There is no midline shift or other associated mass effect. No acute large territory infarct is identified. Confluent cerebral white matter hypodensities and patchy hypodensity in the basal ganglia and thalami are similar to the prior study and nonspecific but compatible with severe chronic small vessel ischemic disease. There is moderate cerebral atrophy. Vascular: Intracranial arterial  dolichoectasia and extensive calcific atherosclerosis. Skull: No fracture. Other: Right periorbital hematoma. CT MAXILLOFACIAL FINDINGS Osseous: No acute fracture, mandibular dislocation, or destructive osseous process. Edentulous. Orbits: Right periorbital hematoma. No retro-orbital hematoma. Bilateral cataract extraction. Sinuses: Paranasal sinuses and mastoid air cells are clear. Soft tissues: No additional findings. CT CERVICAL SPINE FINDINGS Alignment: Cervical spine straightening. No listhesis. Skull base and vertebrae: No acute fracture or destructive osseous process. Soft tissues and spinal canal: No prevertebral fluid or swelling. No visible canal hematoma. Disc levels: Degenerative ankylosis across the C5-6 and C6-7 disc spaces. Mild disc space narrowing at C4-5. Anterior vertebral osteophyte formation extending from C4 into the thoracic spine. Upper chest: Clear lung apices. Other: None. IMPRESSION: 1. Thin acute subdural hematoma over the right cerebral convexity. No mass effect. 2. Severe chronic small vessel ischemic disease. 3. Right periorbital hematoma. No acute maxillofacial or cervical spine fracture. Critical Value/emergent results were called by telephone at the time of interpretation on 10/19/2018 at 10:45 am to Dr. Daryel November , who verbally acknowledged these results. Electronically Signed   By: Sebastian Ache M.D.   On: 10/19/2018 10:45   Dg Chest Portable 1 View  Result Date: 10/21/2018 CLINICAL DATA:  Shortness of breath EXAM: PORTABLE CHEST 1 VIEW COMPARISON:  10/19/2018 FINDINGS: Cardiac shadow is enlarged. Aortic calcifications are noted. Lungs are well aerated bilaterally. No focal infiltrate or sizable effusion is seen. Degenerative changes of the thoracic spine are noted. IMPRESSION: No acute abnormality noted. Electronically Signed   By: Alcide Clever M.D.   On: 10/21/2018 02:26   Ct Maxillofacial Wo Contrast  Result Date: 10/19/2018 CLINICAL DATA:  Fall. Unresponsive.  History of dementia. EXAM: CT HEAD WITHOUT CONTRAST CT MAXILLOFACIAL WITHOUT CONTRAST CT CERVICAL SPINE WITHOUT CONTRAST TECHNIQUE: Multidetector CT imaging of the head, cervical spine, and maxillofacial structures were performed using the standard protocol without intravenous contrast. Multiplanar CT image reconstructions of the cervical spine and maxillofacial structures were also generated. COMPARISON:  Head CT 03/02/2018. Cervical spine CT 10/05/2017. FINDINGS: CT HEAD FINDINGS Brain: A thin acute subdural hematoma over the right cerebral convexity measures up to 3 mm in thickness. There is no midline shift or  other associated mass effect. No acute large territory infarct is identified. Confluent cerebral white matter hypodensities and patchy hypodensity in the basal ganglia and thalami are similar to the prior study and nonspecific but compatible with severe chronic small vessel ischemic disease. There is moderate cerebral atrophy. Vascular: Intracranial arterial dolichoectasia and extensive calcific atherosclerosis. Skull: No fracture. Other: Right periorbital hematoma. CT MAXILLOFACIAL FINDINGS Osseous: No acute fracture, mandibular dislocation, or destructive osseous process. Edentulous. Orbits: Right periorbital hematoma. No retro-orbital hematoma. Bilateral cataract extraction. Sinuses: Paranasal sinuses and mastoid air cells are clear. Soft tissues: No additional findings. CT CERVICAL SPINE FINDINGS Alignment: Cervical spine straightening. No listhesis. Skull base and vertebrae: No acute fracture or destructive osseous process. Soft tissues and spinal canal: No prevertebral fluid or swelling. No visible canal hematoma. Disc levels: Degenerative ankylosis across the C5-6 and C6-7 disc spaces. Mild disc space narrowing at C4-5. Anterior vertebral osteophyte formation extending from C4 into the thoracic spine. Upper chest: Clear lung apices. Other: None. IMPRESSION: 1. Thin acute subdural hematoma over the  right cerebral convexity. No mass effect. 2. Severe chronic small vessel ischemic disease. 3. Right periorbital hematoma. No acute maxillofacial or cervical spine fracture. Critical Value/emergent results were called by telephone at the time of interpretation on 10/19/2018 at 10:45 am to Dr. Daryel NovemberJONATHAN WILLIAMS , who verbally acknowledged these results. Electronically Signed   By: Sebastian AcheAllen  Grady M.D.   On: 10/19/2018 10:45    Pending Labs Unresulted Labs (From admission, onward)    Start     Ordered   10/21/18 0500  Basic metabolic panel  Tomorrow morning,   STAT     10/21/18 0502   10/21/18 0500  CBC  Tomorrow morning,   STAT     10/21/18 0502   10/21/18 0435  Urine Culture  Add-on,   AD     10/21/18 0434   10/21/18 0433  Blood culture (routine x 2)  BLOOD CULTURE X 2,   STAT     10/21/18 0432          Vitals/Pain Today's Vitals   10/21/18 0140 10/21/18 0300 10/21/18 0400 10/21/18 0500  BP:  115/82 (!) 121/94 117/85  Pulse:  79 80 75  Resp:  (!) 21 20 17   Temp: 97.8 F (36.6 C)     TempSrc: Rectal     SpO2:  100% 97% 95%    Isolation Precautions No active isolations  Medications Medications  aspirin EC tablet 81 mg (has no administration in time range)  amLODipine (NORVASC) tablet 5 mg (has no administration in time range)  donepezil (ARICEPT) tablet 10 mg (has no administration in time range)  PARoxetine (PAXIL) tablet 40 mg (has no administration in time range)  alum & mag hydroxide-simeth (MAALOX/MYLANTA) 200-200-20 MG/5ML suspension 30 mL (has no administration in time range)  magnesium hydroxide (MILK OF MAGNESIA) suspension 30 mL (has no administration in time range)  pantoprazole (PROTONIX) EC tablet 40 mg (has no administration in time range)  finasteride (PROSCAR) tablet 5 mg (has no administration in time range)  mirabegron ER (MYRBETRIQ) tablet 25 mg (has no administration in time range)  tamsulosin (FLOMAX) capsule 0.4 mg (has no administration in time range)   Melatonin TABS 6 mg (has no administration in time range)  Vitamin D3 CAPS 5,000 Units (has no administration in time range)  guaifenesin (ROBITUSSIN) 100 MG/5ML syrup 200 mg (has no administration in time range)  loratadine (CLARITIN) tablet 10 mg (has no administration in time range)  enoxaparin (LOVENOX)  injection 40 mg (has no administration in time range)  0.9 %  sodium chloride infusion (has no administration in time range)  acetaminophen (TYLENOL) tablet 650 mg (has no administration in time range)    Or  acetaminophen (TYLENOL) suppository 650 mg (has no administration in time range)  traZODone (DESYREL) tablet 25 mg (has no administration in time range)  ondansetron (ZOFRAN) tablet 4 mg (has no administration in time range)    Or  ondansetron (ZOFRAN) injection 4 mg (has no administration in time range)  0.45 % sodium chloride infusion (has no administration in time range)  sodium chloride 0.9 % bolus 500 mL (0 mLs Intravenous Stopped 10/21/18 0144)  sodium chloride 0.9 % bolus 500 mL (500 mLs Intravenous New Bag/Given 10/21/18 0451)    Mobility non-ambulatory High fall risk   Focused Assessments Neuro Assessment Handoff:  Swallow screen pass? No          Neuro Assessment:   Neuro Checks:      Last Documented NIHSS Modified Score:   Has TPA been given? No If patient is a Neuro Trauma and patient is going to OR before floor call report to 4N Charge nurse: [phone] or [phone]     R Recommendations: See Admitting Provider Note  Report given to:   Additional Notes: Pt is non-verbal at this time, pt is responsive to painful stimuli

## 2018-10-21 NOTE — Consult Note (Signed)
Consultation Note Date: 10/21/2018   Patient Name: Andrew Henderson  DOB: 02/01/1932  MRN: [id number]  Age / Sex: 85 y.o., male  PCP: Patient, No Pcp Per Referring Physician: Gorden Harms, MD  Reason for Consultation: Establishing goals of care  HPI/Patient Profile: Andrew Henderson  is a 85 y.o. Caucasian male from [place] with a known history of Alzheimer's dementia, hypertension, major depressive disorder and BPH, who presented to the emergency room with acute onset of altered mental status with decreased responsiveness.  Clinical Assessment and Goals of Care: Patient is resting in bed and appears comfortable. He awakens and says "yea" to every question. Spoke with his son. Son states he is HPOA. He has a sister. The patient has been widowed for around 10 years. He has had dementia for around 2.5 years.   He uses a Administrator, arts for mobility. He was last conversive with his family in Nov/Decemeber 2019. His son has not seen him since February 2/2 visitor restrictions.    We discussed his diagnoses, prognosis, GOC, EOL wishes disposition and options.  A detailed discussion was had today regarding advanced directives.  Concepts specific to code status, artifical feeding and hydration, IV antibiotics and rehospitalization were discussed.  The difference between an aggressive medical intervention path and a comfort care path was discussed.  Values and goals of care important to patient and family were attempted to be elicited.    He states the primary MD has spoken with him and updated him. He states he advised hospice. Andrew Henderson states his father's QOL has been poor and he does not want him to suffer any further. Spoke with his sister Andrew Henderson, she agrees her brother is HPOA, and states she would like to focus on comfort for her father as well.   I completed an electronic MOST form today and the signed  original was placed in the chart. A photocopy was placed in the chart to be scanned into EMR. The patient outlined their wishes for the following treatment decisions:  Cardiopulmonary Resuscitation: Do Not Attempt Resuscitation (DNR/No CPR)  Medical Interventions: Comfort Measures: Keep clean, warm, and dry. Use medication by any route, positioning, wound care, and other measures to relieve pain and suffering. Use oxygen, suction and manual treatment of airway obstruction as needed for comfort. Do not transfer to the hospital unless comfort needs cannot be met in current location.  Antibiotics: No antibiotics (use other measures to relieve symptoms)  IV Fluids: No IV fluids (provide other measures to ensure comfort)  Feeding Tube: No feeding tube         [place] [facility] Comfort care     Symptom Management:   EOL order set placed.   Prognosis:   < 2 weeks  Discharge Planning: Hospice facility      Primary Diagnoses: Present on Admission: . Encephalopathy acute   I have reviewed the medical record, interviewed the patient and family, and examined the patient. The following aspects are pertinent.  Past Medical History:  Diagnosis Date  . Alzheimer's dementia ([place])   . Enlarged prostate   . Hypertension   . Major depressive disorder    Social History   Socioeconomic History  . Marital status: Widowed    Spouse name: Not on file  . Number of children: Not on file  . Years of education: Not on file  . Highest education level: Not on file  Occupational History  . Not on file  Social Needs  . Financial resource strain: Not on file  . Food insecurity:    Worry: Not on file    Inability: Not on file  . Transportation needs:    Medical: Not on file    Non-medical: Not on file  Tobacco Use  . Smoking status: Never Smoker  . Smokeless tobacco: Never Used  Substance and Sexual Activity  . Alcohol use: No    Frequency: Never  . Drug  use: No  . Sexual activity: Not on file  Lifestyle  . Physical activity:    Days per week: Not on file    Minutes per session: Not on file  . Stress: Not on file  Relationships  . Social connections:    Talks on phone: Not on file    Gets together: Not on file    Attends religious service: Not on file    Active member of club or organization: Not on file    Attends meetings of clubs or organizations: Not on file    Relationship status: Not on file  Other Topics Concern  . Not on file  Social History Narrative  . Not on file   History reviewed. No pertinent family history. Scheduled Meds: . amLODipine  5 mg Oral Daily  . aspirin EC  81 mg Oral Daily  . cholecalciferol  5,000 Units Oral Daily  . enoxaparin (LOVENOX) injection  40 mg Subcutaneous Q24H  . finasteride  5 mg Oral Daily  . loratadine  10 mg Oral Daily  . mirabegron ER  25 mg Oral Daily  . pantoprazole (PROTONIX) IV  40 mg Intravenous Q24H  . potassium chloride  40 mEq Oral Once  . tamsulosin  0.4 mg Oral Daily   Continuous Infusions: . dextrose 75 mL/hr at 10/21/18 0841   PRN Meds:.acetaminophen **OR** acetaminophen, alum & mag hydroxide-simeth, guaiFENesin, hydrALAZINE, magnesium hydroxide, ondansetron **OR** ondansetron (ZOFRAN) IV Medications Prior to Admission:  Prior to Admission medications   Medication Sig Start Date End Date Taking? Authorizing Provider  acetaminophen (TYLENOL) 500 MG tablet Take 500 mg by mouth every 4 (four) hours as needed for fever.   Yes [provider]  alum & mag hydroxide-simeth (MINTOX) 200-200-20 MG/5ML suspension Take 30 mLs by mouth as needed for indigestion or heartburn.   Yes [provider]  amLODipine (NORVASC) 5 MG tablet Take 5 mg by mouth daily.   Yes [provider]  aspirin EC 81 MG tablet Take 81 mg by mouth daily.   Yes [provider]  Cholecalciferol (VITAMIN D3) 5000 units CAPS Take 5,000 Units by mouth daily.   Yes [provider]  donepezil (ARICEPT) 10 MG tablet Take 10 mg by mouth at bedtime.    Yes [provider]  finasteride (PROSCAR) 5 MG tablet Take 5 mg by mouth daily.   Yes [provider]  guaifenesin (ROBITUSSIN) 100 MG/5ML syrup Take 200 mg by mouth every 6 (six) hours as needed for cough.   Yes [provider]  ibuprofen (ADVIL,MOTRIN) 400 MG tablet  Take 400 mg by mouth 2 (two) times daily.   Yes [provider]  loperamide (IMODIUM) 2 MG capsule Take 2 mg by mouth as needed for diarrhea or loose stools.   Yes [provider]  loratadine (CLARITIN) 10 MG tablet Take 10 mg by mouth daily.    Yes [provider]  magnesium hydroxide (MILK OF MAGNESIA) 400 MG/5ML suspension Take 30 mLs by mouth at bedtime as needed for mild constipation.   Yes [provider]  medroxyPROGESTERone (PROVERA) 10 MG tablet Take 20 mg by mouth daily.   Yes [provider]  Melatonin 3 MG TABS Take 6 mg by mouth at bedtime.   Yes [provider]  mirabegron ER (MYRBETRIQ) 25 MG TB24 tablet Take 25 mg by mouth daily.   Yes [provider]  neomycin-bacitracin-polymyxin (NEOSPORIN) ointment Apply 1 application topically as needed for wound care.   Yes [provider]  omeprazole (PRILOSEC) 20 MG capsule Take 20 mg by mouth daily.   Yes [provider]  PARoxetine (PAXIL) 40 MG tablet Take 40 mg by mouth daily.   Yes [provider]  risperiDONE (RISPERDAL) 0.25 MG tablet Take 0.25 mg by mouth 3 (three) times daily.    Yes [provider]  tamsulosin (FLOMAX) 0.4 MG CAPS capsule Take 0.4 mg by mouth daily.    Yes [provider]  torsemide (DEMADEX) 20 MG tablet Take 20 mg by mouth daily.   Yes [provider]   Allergies  Allergen Reactions  . Nifedipine Er    Review of Systems  Unable to perform ROS   Physical Exam Constitutional:      Comments: Says "yea" to all  questions without opening eyes.   Pulmonary:     Effort: Pulmonary effort is normal.  Skin:    General: Skin is warm and dry.     Comments: Bruising to face and neck.      Vital Signs: BP (!) 125/93 (BP Location: Left Arm)   Pulse 88   Temp 98.1 F (36.7 C) (Oral)   Resp 19   Ht '6\' 2"'$  (1.88 m)   Wt 83.4 kg   SpO2 100%   BMI 23.60 kg/m  Pain Scale: 0-10   Pain Score: Asleep   SpO2: SpO2: 100 % O2 Device:SpO2: 100 % O2 Flow Rate: .   IO: Intake/output summary:   Intake/Output Summary (Last 24 hours) at 10/21/2018 1548 Last data filed at 10/21/2018 1400 Gross per 24 hour  Intake 328.67 ml  Output 275 ml  Net 53.67 ml    LBM:   Baseline Weight: Weight: 83.4 kg Most recent weight: Weight: 83.4 kg     Palliative Assessment/Data: 10%     Time In: 2:40 Time Out: 3:50 Time Total: 70 min Greater than 50%  of this time was spent counseling and coordinating care related to the above assessment and plan.  Signed by: Asencion Gowda, NP   Please contact Palliative Medicine Team phone at [phone] for questions and concerns.  For individual provider: See Shea Evans

## 2018-10-21 NOTE — H&P (Addendum)
[facility] [place] [facility]   PATIENT NAME: Andrew GibsonMarvin Henderson    MR#:  [id number]  DATE OF BIRTH:  29-Dec-1931  DATE OF ADMISSION:  10/21/2018  PRIMARY CARE PHYSICIAN: Patient, No Pcp Per   REQUESTING/REFERRING PHYSICIAN: Willy Eddyobinson, Patrick, MD  CHIEF COMPLAINT:   Chief Complaint  Patient presents with   Altered Mental Status    HISTORY OF PRESENT ILLNESS:  Andrew Henderson  is a 85 y.o. Caucasian male from [place] with a known history of Alzheimer's dementia, hypertension, major depressive disorder and BPH, who presented to the emergency room with acute onset of altered mental status with decreased responsiveness.  Patient had a recent fall with a 1 minute loss of consciousness.  He was recently evaluated for a 2.6 mm right subdural hematoma for which she had a follow-up head CT scan revealed no change in size.  It showed atrophy and chronic small vessel ischemic changes.  It also showed the right periorbital hematoma with no acute maxillofacial or cervical spine fracture on his maxillofacial and C-spine CTs.  He is a non-historian due to his advanced dementia and somnolence.  The patient had a right ear and eyebrow laceration that was sutured in addition to his right periorbital hematoma.  Upon presentation to the emergency room heart rate was 59 and otherwise vital signs were within normal.  Pulse continues 85% however this was probably inaccurate as shortly after that pulse extremity was 97% on room air.  Labs were remarkable for a sodium of 149 with a creatinine 1.96 and a BUN of 33.  Albumin was 3.4.  Troponin I less than 0.03.  WBCs were 12.5 hemoglobin 12.7 hematocrit 40.4.  Urinalysis was unremarkable.  The patient had a noncontrasted head CT scan that came back negative for acute intracranial abnormalities.  His previously seen right-sided subdural hematoma is not well appreciated on today's exam.  It showed chronic atrophic and ischemic changes that are stable  from previous study.  Portable chest x-ray showed no acute cardiopulmonary disease.  EKG showed normal sinus rhythm with rate of 88 with Q waves anteroseptally and inferiorly.  The patient was given 2 boluses of IV normal saline 500 mL each.  He will be admitted to an observation medically monitored bed for further evaluation and management. PAST MEDICAL HISTORY:   Past Medical History:  Diagnosis Date   Alzheimer's dementia (HCC)    Enlarged prostate    Hypertension    Major depressive disorder     PAST SURGICAL HISTORY:  History reviewed. No pertinent surgical history.  SOCIAL HISTORY:   Social History   Tobacco Use   Smoking status: Unknown If Ever Smoked   Smokeless tobacco: Never Used  Substance Use Topics   Alcohol use: No    Frequency: Never    FAMILY HISTORY:  History reviewed. No pertinent family history.  DRUG ALLERGIES:   Allergies  Allergen Reactions   Nifedipine Er     REVIEW OF SYSTEMS:   ROS As per history of present illness. All pertinent systems were reviewed above. Constitutional,  HEENT, cardiovascular, respiratory, GI, GU, musculoskeletal, neuro, psychiatric, endocrine,  integumentary and hematologic systems were reviewed and are otherwise  negative/unremarkable except for positive findings mentioned above in the HPI.   MEDICATIONS AT HOME:   Prior to Admission medications   Medication Sig Start Date End Date Taking? Authorizing Provider  acetaminophen (TYLENOL) 500 MG tablet Take 500 mg by mouth every 4 (four) hours as needed for fever.  [provider]  alum & mag hydroxide-simeth (MINTOX) 200-200-20 MG/5ML suspension Take 30 mLs by mouth as needed for indigestion or heartburn.    [provider]  amLODipine (NORVASC) 5 MG tablet Take 5 mg by mouth daily.    [provider]  aspirin EC 81 MG tablet Take 81 mg by mouth daily.    [provider]  Cholecalciferol (VITAMIN D3) 5000 units CAPS Take  5,000 Units by mouth daily.    [provider]  donepezil (ARICEPT) 10 MG tablet Take 10 mg by mouth at bedtime.     [provider]  finasteride (PROSCAR) 5 MG tablet Take 5 mg by mouth daily.    [provider]  guaifenesin (ROBITUSSIN) 100 MG/5ML syrup Take 200 mg by mouth every 6 (six) hours as needed for cough.    [provider]  ibuprofen (ADVIL,MOTRIN) 400 MG tablet Take 400 mg by mouth 2 (two) times daily.    [provider]  loperamide (IMODIUM) 2 MG capsule Take 2 mg by mouth as needed for diarrhea or loose stools.    [provider]  loratadine (CLARITIN) 10 MG tablet Take 10 mg by mouth daily.     [provider]  magnesium hydroxide (MILK OF MAGNESIA) 400 MG/5ML suspension Take 30 mLs by mouth at bedtime as needed for mild constipation.    [provider]  medroxyPROGESTERone (PROVERA) 10 MG tablet Take 20 mg by mouth daily.    [provider]  Melatonin 3 MG TABS Take 6 mg by mouth at bedtime.    [provider]  mirabegron ER (MYRBETRIQ) 25 MG TB24 tablet Take 25 mg by mouth daily.    [provider]  neomycin-bacitracin-polymyxin (NEOSPORIN) ointment Apply 1 application topically as needed for wound care.    [provider]  omeprazole (PRILOSEC) 20 MG capsule Take 20 mg by mouth daily.    [provider]  PARoxetine (PAXIL) 40 MG tablet Take 40 mg by mouth daily.    [provider]  risperiDONE (RISPERDAL) 0.25 MG tablet Take 0.25 mg by mouth 3 (three) times daily.     [provider]  tamsulosin (FLOMAX) 0.4 MG CAPS capsule Take 0.4 mg by mouth daily.     [provider]  torsemide (DEMADEX) 20 MG tablet Take 20 mg by mouth daily.    [provider]      VITAL SIGNS:  Blood pressure 110/78, pulse (!) 59, temperature 97.8 F (36.6 C), temperature source Rectal, SpO2 (!) 85 %.  PHYSICAL EXAMINATION:  Physical  Exam  GENERAL:  85 y.o.-year-old early somnolent Caucasian male patient lying in the bed with no acute distress.  He was difficult to arouse. EYES: Pupils equal, round, reactive to light and accommodation. No scleral icterus. Extraocular muscles intact.  HEENT: Head with right periorbital hematoma as well as sutured right eyebrow laceration, normocephalic. Oropharynx with dry mucous membrane and tongue and nasopharynx clear.  NECK:  Supple, no jugular venous distention. No thyroid enlargement, no tenderness.  LUNGS: Normal breath sounds bilaterally, no wheezing, rales,rhonchi or crepitation. No use of accessory muscles of respiration.  CARDIOVASCULAR: Regular rate and rhythm, S1, S2 normal. No murmurs, rubs, or gallops.  ABDOMEN: Soft, nondistended, nontender. Bowel sounds present. No organomegaly or mass.  EXTREMITIES: Trace bilateral lower extremity leg and pedal pitting edema, with no cyanosis, or clubbing.  NEUROLOGIC: No lateralizing signs.  The patient was very somnolent and unresponsive.  He was moving all 4 extremities  PSYCHIATRIC: He was unresponsive. SKIN: Right periorbital hematoma and right ear and eyebrow laceration status post repair.  LABORATORY PANEL:   CBC Recent Labs  Lab 10/21/18 0129  WBC 12.5*  HGB 12.7*  HCT 40.4  PLT 164   ------------------------------------------------------------------------------------------------------------------  Chemistries  Recent Labs  Lab 10/21/18 0128  NA 149*  K 3.8  CL 114*  CO2 26  GLUCOSE 106*  BUN 33*  CREATININE 1.96*  CALCIUM 8.8*  AST 17  ALT 15  ALKPHOS 102  BILITOT 1.1   ------------------------------------------------------------------------------------------------------------------  Cardiac Enzymes Recent Labs  Lab 10/21/18 0129  TROPONINI <0.03   ------------------------------------------------------------------------------------------------------------------  RADIOLOGY:  Dg Chest 1 View  Result  Date: 10/19/2018 CLINICAL DATA:  85 year old male with altered mental status after falling EXAM: CHEST  1 VIEW COMPARISON:  Prior chest x-ray 02/27/2018 FINDINGS: Stable cardiac and mediastinal contours. Mild cardiomegaly. Atherosclerotic calcifications are present in the transverse aorta. Inspiratory volumes are low. There is mild pulmonary vascular congestion without overt edema. Streaky bibasilar airspace opacities are favored to reflect atelectasis. No acute osseous abnormality. 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 IMPRESSION: 1. Low inspiratory volumes with perihilar and bibasilar atelectasis. 2. Cardiomegaly with mild vascular congestion but no overt pulmonary edema. 3.  Aortic Atherosclerosis (ICD10-170.0) Electronically Signed   By: Malachy Moan M.D.   On: 10/19/2018 10:09   Ct Head Wo Contrast  Result Date: 10/21/2018 CLINICAL DATA:  Follow-up subdural hematoma EXAM: CT HEAD WITHOUT CONTRAST TECHNIQUE: Contiguous axial images were obtained from the base of the skull through the vertex without intravenous contrast. COMPARISON:  10/19/2018 FINDINGS: Brain: Diffuse atrophic changes and chronic white matter ischemic changes are again identified. The previously seen right-sided subdural hematoma is less well appreciated on today's exam. No new areas of hemorrhage are seen. No acute infarction is noted. Vascular: No hyperdense vessel or unexpected calcification. Skull: Normal. Negative for fracture or focal lesion. Sinuses/Orbits: No acute finding. Other: None. IMPRESSION: Previously seen right-sided subdural hematoma is not well appreciated on today's exam. Chronic atrophic and ischemic changes stable from the previous study. No new focal abnormality is noted. Electronically Signed   By: Alcide Clever M.D.   On: 10/21/2018 02:32   Ct Head Wo Contrast  Result Date: 10/19/2018 CLINICAL DATA:  85 year old male for follow-up of subdural hematoma. EXAM: CT HEAD  WITHOUT CONTRAST TECHNIQUE: Contiguous axial images were obtained from the base of the skull through the vertex without intravenous contrast. COMPARISON:  None. FINDINGS: Brain: The small subpleural hematoma overlying the RIGHT cerebral convexity is unchanged measuring 2.6 mm in greatest diameter. No new hemorrhage or midline shift identified. Atrophy and moderate to severe chronic small-vessel white matter ischemic changes again noted. Ischemic changes within the basal ganglia/thalami again noted. Vascular: Heavy atherosclerotic calcifications again noted. Skull: No acute abnormality Sinuses/Orbits: No acute abnormality Other: None IMPRESSION: Unchanged 2.6 mm RIGHT subdural hematoma.  No new findings. Atrophy and chronic small-vessel ischemic changes. Electronically Signed   By: Harmon Pier M.D.   On: 10/19/2018 15:50   Ct Head Wo Contrast  Result Date: 10/19/2018 CLINICAL DATA:  Fall. Unresponsive. History of dementia. EXAM: CT HEAD WITHOUT CONTRAST CT MAXILLOFACIAL WITHOUT CONTRAST CT CERVICAL SPINE WITHOUT CONTRAST TECHNIQUE: Multidetector CT imaging of the head, cervical spine, and maxillofacial structures were performed using the standard protocol without intravenous contrast. Multiplanar CT image reconstructions of the cervical spine and maxillofacial structures were also generated. COMPARISON:  Head CT 03/02/2018. Cervical spine CT 10/05/2017. FINDINGS: CT HEAD FINDINGS Brain: A  thin acute subdural hematoma over the right cerebral convexity measures up to 3 mm in thickness. There is no midline shift or other associated mass effect. No acute large territory infarct is identified. Confluent cerebral white matter hypodensities and patchy hypodensity in the basal ganglia and thalami are similar to the prior study and nonspecific but compatible with severe chronic small vessel ischemic disease. There is moderate cerebral atrophy. Vascular: Intracranial arterial dolichoectasia and extensive calcific  atherosclerosis. Skull: No fracture. Other: Right periorbital hematoma. CT MAXILLOFACIAL FINDINGS Osseous: No acute fracture, mandibular dislocation, or destructive osseous process. Edentulous. Orbits: Right periorbital hematoma. No retro-orbital hematoma. Bilateral cataract extraction. Sinuses: Paranasal sinuses and mastoid air cells are clear. Soft tissues: No additional findings. CT CERVICAL SPINE FINDINGS Alignment: Cervical spine straightening. No listhesis. Skull base and vertebrae: No acute fracture or destructive osseous process. Soft tissues and spinal canal: No prevertebral fluid or swelling. No visible canal hematoma. Disc levels: Degenerative ankylosis across the C5-6 and C6-7 disc spaces. Mild disc space narrowing at C4-5. Anterior vertebral osteophyte formation extending from C4 into the thoracic spine. Upper chest: Clear lung apices. Other: None. IMPRESSION: 1. Thin acute subdural hematoma over the right cerebral convexity. No mass effect. 2. Severe chronic small vessel ischemic disease. 3. Right periorbital hematoma. No acute maxillofacial or cervical spine fracture. Critical Value/emergent results were called by telephone at the time of interpretation on 10/19/2018 at 10:45 am to Dr. Daryel November , who verbally acknowledged these results. Electronically Signed   By: Sebastian Ache M.D.   On: 10/19/2018 10:45   Ct Cervical Spine Wo Contrast  Result Date: 10/19/2018 CLINICAL DATA:  Fall. Unresponsive. History of dementia. EXAM: CT HEAD WITHOUT CONTRAST CT MAXILLOFACIAL WITHOUT CONTRAST CT CERVICAL SPINE WITHOUT CONTRAST TECHNIQUE: Multidetector CT imaging of the head, cervical spine, and maxillofacial structures were performed using the standard protocol without intravenous contrast. Multiplanar CT image reconstructions of the cervical spine and maxillofacial structures were also generated. COMPARISON:  Head CT 03/02/2018. Cervical spine CT 10/05/2017. FINDINGS: CT HEAD FINDINGS Brain: A thin  acute subdural hematoma over the right cerebral convexity measures up to 3 mm in thickness. There is no midline shift or other associated mass effect. No acute large territory infarct is identified. Confluent cerebral white matter hypodensities and patchy hypodensity in the basal ganglia and thalami are similar to the prior study and nonspecific but compatible with severe chronic small vessel ischemic disease. There is moderate cerebral atrophy. Vascular: Intracranial arterial dolichoectasia and extensive calcific atherosclerosis. Skull: No fracture. Other: Right periorbital hematoma. CT MAXILLOFACIAL FINDINGS Osseous: No acute fracture, mandibular dislocation, or destructive osseous process. Edentulous. Orbits: Right periorbital hematoma. No retro-orbital hematoma. Bilateral cataract extraction. Sinuses: Paranasal sinuses and mastoid air cells are clear. Soft tissues: No additional findings. CT CERVICAL SPINE FINDINGS Alignment: Cervical spine straightening. No listhesis. Skull base and vertebrae: No acute fracture or destructive osseous process. Soft tissues and spinal canal: No prevertebral fluid or swelling. No visible canal hematoma. Disc levels: Degenerative ankylosis across the C5-6 and C6-7 disc spaces. Mild disc space narrowing at C4-5. Anterior vertebral osteophyte formation extending from C4 into the thoracic spine. Upper chest: Clear lung apices. Other: None. IMPRESSION: 1. Thin acute subdural hematoma over the right cerebral convexity. No mass effect. 2. Severe chronic small vessel ischemic disease. 3. Right periorbital hematoma. No acute maxillofacial or cervical spine fracture. Critical Value/emergent results were called by telephone at the time of interpretation on 10/19/2018 at 10:45 am to Dr. Daryel November , who verbally acknowledged these  results. Electronically Signed   By: Sebastian Ache M.D.   On: 10/19/2018 10:45   Dg Chest Portable 1 View  Result Date: 10/21/2018 CLINICAL DATA:   Shortness of breath EXAM: PORTABLE CHEST 1 VIEW COMPARISON:  10/19/2018 FINDINGS: Cardiac shadow is enlarged. Aortic calcifications are noted. Lungs are well aerated bilaterally. No focal infiltrate or sizable effusion is seen. Degenerative changes of the thoracic spine are noted. IMPRESSION: No acute abnormality noted. Electronically Signed   By: Alcide Clever M.D.   On: 10/21/2018 02:26   Ct Maxillofacial Wo Contrast  Result Date: 10/19/2018 CLINICAL DATA:  Fall. Unresponsive. History of dementia. EXAM: CT HEAD WITHOUT CONTRAST CT MAXILLOFACIAL WITHOUT CONTRAST CT CERVICAL SPINE WITHOUT CONTRAST TECHNIQUE: Multidetector CT imaging of the head, cervical spine, and maxillofacial structures were performed using the standard protocol without intravenous contrast. Multiplanar CT image reconstructions of the cervical spine and maxillofacial structures were also generated. COMPARISON:  Head CT 03/02/2018. Cervical spine CT 10/05/2017. FINDINGS: CT HEAD FINDINGS Brain: A thin acute subdural hematoma over the right cerebral convexity measures up to 3 mm in thickness. There is no midline shift or other associated mass effect. No acute large territory infarct is identified. Confluent cerebral white matter hypodensities and patchy hypodensity in the basal ganglia and thalami are similar to the prior study and nonspecific but compatible with severe chronic small vessel ischemic disease. There is moderate cerebral atrophy. Vascular: Intracranial arterial dolichoectasia and extensive calcific atherosclerosis. Skull: No fracture. Other: Right periorbital hematoma. CT MAXILLOFACIAL FINDINGS Osseous: No acute fracture, mandibular dislocation, or destructive osseous process. Edentulous. Orbits: Right periorbital hematoma. No retro-orbital hematoma. Bilateral cataract extraction. Sinuses: Paranasal sinuses and mastoid air cells are clear. Soft tissues: No additional findings. CT CERVICAL SPINE FINDINGS Alignment: Cervical spine  straightening. No listhesis. Skull base and vertebrae: No acute fracture or destructive osseous process. Soft tissues and spinal canal: No prevertebral fluid or swelling. No visible canal hematoma. Disc levels: Degenerative ankylosis across the C5-6 and C6-7 disc spaces. Mild disc space narrowing at C4-5. Anterior vertebral osteophyte formation extending from C4 into the thoracic spine. Upper chest: Clear lung apices. Other: None. IMPRESSION: 1. Thin acute subdural hematoma over the right cerebral convexity. No mass effect. 2. Severe chronic small vessel ischemic disease. 3. Right periorbital hematoma. No acute maxillofacial or cervical spine fracture. Critical Value/emergent results were called by telephone at the time of interpretation on 10/19/2018 at 10:45 am to Dr. Daryel November , who verbally acknowledged these results. Electronically Signed   By: Sebastian Ache M.D.   On: 10/19/2018 10:45      IMPRESSION AND PLAN:   #1.  Acute encephalopathy.  The patient has a resolved right subdural hematoma secondary to recent fal.  His encephalopathy could be multifactorial secondary to volume depletion with mild dehydration as manifested by hypernatremia of 149 and elevated BUN and creatinine as well as polypharmacy.  He will be placed in observation in a medically monitored bed.  He will be placed on hydration with IV half-normal saline and will follow his neuro checks.  2.  Acute kidney injury.  This is likely prerenal secondary to dehydration.  He will be hydrated with half-normal saline and will follow his BMP in a.m. diuretic therapy will be held off.  3.  Hypernatremia.  Management as above.  This likely hypovolemic.  4.  Dementia with behavioral changes.  Aricept and Risperdal will be continued.  5.  BPH.  Flomax and Proscar will be resumed.  6.  Hypertension.  Will  continue amlodipine  7.  Depression.  Myrbetriq and Paxil will be resumed.  8.  DVT prophylaxis.  Subcutaneous Lovenox.  All  the records are reviewed and case discussed with ED provider. The plan of care was discussed in details with the patient (and family). I answered all questions. The patient agreed to proceed with the above mentioned plan. Further management will depend upon hospital course.   CODE STATUS: Full code  TOTAL TIME TAKING CARE OF THIS PATIENT: 45 minutes.    Hannah Beat M.D on 10/21/2018 at 5:05 AM  Pager - [phone]  After 6pm go to [email or web address] - Social research officer, government  [facility] [place] [facility]  Office  [phone]  CC: Primary care physician; Patient, No Pcp Per   Note: This dictation was prepared with Dragon dictation along with smaller phrase technology. Any transcriptional errors that result from this process are unintentional.

## 2018-10-21 NOTE — ED Provider Notes (Signed)
[facility] Emergency Department Provider Note    First MD Initiated Contact with Patient 10/21/18 0118     (approximate)  I have reviewed the triage vital signs and the nursing notes.   HISTORY  Chief Complaint Altered Mental Status  Level V Caveat:  AMS  HPI Raybon Conard is a 85 y.o. male history of Alzheimer's dementia presents the ER via EMS with altered mental status.  Uncertain last seen normal.  Patient recently evaluated after fall with head bleed.  Discharged back to facility.  No report of any fevers.  Patient found to be hypoxic requiring nonrebreather.  No measured fevers reported.    Past Medical History:  Diagnosis Date  . Alzheimer's dementia (HCC)   . Enlarged prostate   . Hypertension   . Major depressive disorder    History reviewed. No pertinent family history. History reviewed. No pertinent surgical history. There are no active problems to display for this patient.     Prior to Admission medications   Medication Sig Start Date End Date Taking? Authorizing Provider  acetaminophen (TYLENOL) 500 MG tablet Take 500 mg by mouth every 4 (four) hours as needed for fever.    [provider]  alum & mag hydroxide-simeth (MINTOX) 200-200-20 MG/5ML suspension Take 30 mLs by mouth as needed for indigestion or heartburn.    [provider]  amLODipine (NORVASC) 5 MG tablet Take 5 mg by mouth daily.    [provider]  aspirin EC 81 MG tablet Take 81 mg by mouth daily.    [provider]  Cholecalciferol (VITAMIN D3) 5000 units CAPS Take 5,000 Units by mouth daily.    [provider]  donepezil (ARICEPT) 10 MG tablet Take 10 mg by mouth at bedtime.     [provider]  finasteride (PROSCAR) 5 MG tablet Take 5 mg by mouth daily.    [provider]  guaifenesin (ROBITUSSIN) 100 MG/5ML syrup Take 200 mg by mouth every 6 (six) hours as needed for cough.    [provider]   ibuprofen (ADVIL,MOTRIN) 400 MG tablet Take 400 mg by mouth 2 (two) times daily.    [provider]  loperamide (IMODIUM) 2 MG capsule Take 2 mg by mouth as needed for diarrhea or loose stools.    [provider]  loratadine (CLARITIN) 10 MG tablet Take 10 mg by mouth daily.     [provider]  magnesium hydroxide (MILK OF MAGNESIA) 400 MG/5ML suspension Take 30 mLs by mouth at bedtime as needed for mild constipation.    [provider]  medroxyPROGESTERone (PROVERA) 10 MG tablet Take 20 mg by mouth daily.    [provider]  Melatonin 3 MG TABS Take 6 mg by mouth at bedtime.    [provider]  mirabegron ER (MYRBETRIQ) 25 MG TB24 tablet Take 25 mg by mouth daily.    [provider]  neomycin-bacitracin-polymyxin (NEOSPORIN) ointment Apply 1 application topically as needed for wound care.    [provider]  omeprazole (PRILOSEC) 20 MG capsule Take 20 mg by mouth daily.    [provider]  PARoxetine (PAXIL) 40 MG tablet Take 40 mg by mouth daily.    [provider]  risperiDONE (RISPERDAL) 0.25 MG tablet Take 0.25 mg by mouth 3 (three) times daily.     [provider]  tamsulosin (FLOMAX) 0.4 MG CAPS capsule Take 0.4 mg by mouth daily.     [provider]  torsemide (DEMADEX) 20 MG tablet Take 20 mg by mouth daily.    [provider]    Allergies Nifedipine er    Social History Social History   Tobacco Use  . Smoking status: Unknown If Ever Smoked  . Smokeless tobacco: Never Used  Substance Use Topics  . Alcohol use: No    Frequency: Never  . Drug use: No    Review of Systems Unable to assess - AMS ____________________________________________   PHYSICAL EXAM:  VITAL SIGNS: Vitals:   10/21/18 0134 10/21/18 0140  BP: 110/78   Pulse: (!) 59   Temp:  97.8 F (36.6 C)  SpO2: (!) 85%     Constitutional: spontaneous respirations requiring supplemental O2.   Eyes: Conjunctivae are normal.  Head right forehead contusion Nose: No congestion/rhinnorhea. Mouth/Throat: Mucous membranes are moist.   Neck: No stridor. Painless ROM.  Cardiovascular: Normal rate, regular rhythm. Grossly normal heart sounds.  Good peripheral circulation. Respiratory: Normal respiratory effort.  No retractions. Lungs with bibasilar crackles Gastrointestinal: Soft and nontender. No distention. No abdominal bruits. No CVA tenderness. Genitourinary: deferred Musculoskeletal: No lower extremity tenderness nor edema.  No joint effusions. Neurologic:  withdrawls to pain bilaterally, eyes closed, no verbal response Skin:  Skin is warm, dry and intact. No rash noted. Psychiatric: unable to assess  ____________________________________________   LABS (all labs ordered are listed, but only abnormal results are displayed)  Results for orders placed or performed during the hospital encounter of 10/21/18 (from the past 24 hour(s))  Comprehensive metabolic panel     Status: Abnormal   Collection Time: 10/21/18  1:28 AM  Result Value Ref Range   Sodium 149 (H) 135 - 145 mmol/L   Potassium 3.8 3.5 - 5.1 mmol/L   Chloride 114 (H) 98 - 111 mmol/L   CO2 26 22 - 32 mmol/L   Glucose, Bld 106 (H) 70 - 99 mg/dL   BUN 33 (H) 8 - 23 mg/dL   Creatinine, Ser 3.53 (H) 0.61 - 1.24 mg/dL   Calcium 8.8 (L) 8.9 - 10.3 mg/dL   Total Protein 6.7 6.5 - 8.1 g/dL   Albumin 3.4 (L) 3.5 - 5.0 g/dL   AST 17 15 - 41 U/L   ALT 15 0 - 44 U/L   Alkaline Phosphatase 102 38 - 126 U/L   Total Bilirubin 1.1 0.3 - 1.2 mg/dL   GFR calc non Af Amer 30 (L) >60 mL/min   GFR calc Af Amer 35 (L) >60 mL/min   Anion gap 9 5 - 15  CBC with Differential/Platelet     Status: Abnormal   Collection Time: 10/21/18  1:29 AM  Result Value Ref Range   WBC 12.5 (H) 4.0 - 10.5 K/uL   RBC 4.16 (L) 4.22 - 5.81 MIL/uL   Hemoglobin 12.7 (L) 13.0 - 17.0 g/dL   HCT 61.4 43.1 - 54.0 %   MCV 97.1 80.0 - 100.0 fL   MCH 30.5  26.0 - 34.0 pg   MCHC 31.4 30.0 - 36.0 g/dL   RDW 08.6 76.1 - 95.0 %   Platelets 164 150 - 400 K/uL   nRBC 0.0 0.0 - 0.2 %   Neutrophils Relative % 63 %   Neutro Abs 7.8 (H) 1.7 - 7.7 K/uL   Lymphocytes Relative 20 %   Lymphs Abs 2.5 0.7 - 4.0 K/uL   Monocytes Relative 15 %   Monocytes Absolute 1.8 (H) 0.1 - 1.0 K/uL   Eosinophils Relative 2 %   Eosinophils Absolute  0.2 0.0 - 0.5 K/uL   Basophils Relative 0 %   Basophils Absolute 0.0 0.0 - 0.1 K/uL   Immature Granulocytes 0 %   Abs Immature Granulocytes 0.05 0.00 - 0.07 K/uL  Blood gas, venous     Status: Abnormal   Collection Time: 10/21/18  1:29 AM  Result Value Ref Range   pH, Ven 7.36 7.250 - 7.430   pCO2, Ven 49 44.0 - 60.0 mmHg   pO2, Ven <31.0 (LL) 32.0 - 45.0 mmHg   Bicarbonate 27.7 20.0 - 28.0 mmol/L   Acid-Base Excess 1.5 0.0 - 2.0 mmol/L   O2 Saturation 31.9 %   Patient temperature 37.0    Collection site VENOUS    Sample type VENOUS   Urinalysis, Complete w Microscopic     Status: Abnormal   Collection Time: 10/21/18  1:29 AM  Result Value Ref Range   Color, Urine YELLOW (A) YELLOW   APPearance HAZY (A) CLEAR   Specific Gravity, Urine 1.018 1.005 - 1.030   pH 5.0 5.0 - 8.0   Glucose, UA NEGATIVE NEGATIVE mg/dL   Hgb urine dipstick NEGATIVE NEGATIVE   Bilirubin Urine NEGATIVE NEGATIVE   Ketones, ur NEGATIVE NEGATIVE mg/dL   Protein, ur NEGATIVE NEGATIVE mg/dL   Nitrite NEGATIVE NEGATIVE   Leukocytes,Ua NEGATIVE NEGATIVE   WBC, UA 0-5 0 - 5 WBC/hpf   Bacteria, UA NONE SEEN NONE SEEN   Squamous Epithelial / LPF 0-5 0 - 5   Mucus PRESENT    Amorphous Crystal PRESENT   Troponin I - Add-On to previous collection     Status: None   Collection Time: 10/21/18  1:29 AM  Result Value Ref Range   Troponin I <0.03 <0.03 ng/mL   ____________________________________________  EKG My review and personal interpretation at Time: 3:13   Indication: ams  Rate: 85  Rhythm: sinus Axis: left Other: nonspecific st abn, no  stemi ____________________________________________  RADIOLOGY  I personally reviewed all radiographic images ordered to evaluate for the above acute complaints and reviewed radiology reports and findings.  These findings were personally discussed with the patient.  Please see medical record for radiology report.  ____________________________________________   PROCEDURES  Procedure(s) performed:  .Critical Care Performed by: Willy Eddy, MD Authorized by: Willy Eddy, MD   Critical care provider statement:    Critical care time (minutes):  30   Critical care time was exclusive of:  Separately billable procedures and treating other patients   Critical care was necessary to treat or prevent imminent or life-threatening deterioration of the following conditions:  CNS failure or compromise   Critical care was time spent personally by me on the following activities:  Development of treatment plan with patient or surrogate, discussions with consultants, evaluation of patient's response to treatment, examination of patient, obtaining history from patient or surrogate, ordering and performing treatments and interventions, ordering and review of laboratory studies, ordering and review of radiographic studies, pulse oximetry, re-evaluation of patient's condition and review of old charts      Critical Care performed: yes ____________________________________________   INITIAL IMPRESSION / ASSESSMENT AND PLAN / ED COURSE  Pertinent labs & imaging results that were available during my care of the patient were reviewed by me and considered in my medical decision making (see chart for details).   DDX: Dehydration, sepsis, pna, uti, hypoglycemia, cva, drug effect, withdrawal, encephalitis   Judy Goodenow is a 85 y.o. who presents to the ED with symptoms as described above.  Patient ill and  frail-appearing.  He has spontaneous respirations.  Protecting his airway.  Placed on  nonrebreather.  Blood work will be sent for the by differential.  The patient will be placed on continuous pulse oximetry and telemetry for monitoring.  Laboratory evaluation will be sent to evaluate for the above complaints.     Clinical Course as of Oct 21 442  Wed Oct 21, 2018  0240 CT appears improved.  Concern for metabolic encephalopathy.  Awaiting blood work and UA results.   [PR]  0302 Patient is spontaneously moving.  He still protecting his airway.  I have a high suspicion that he had a poor waveform as I took him off nonrebreather and he was 100% on room air.  We will continue to monitor but I anticipate we can de-escalate his O2 requirement.   [PR]  0306 Urine appears cloudy.  Certainly concerning for cystitis.   [PR]  0414 Still awaiting results from lab on urine.  He is moving about the bed.  Still protecting his airway.  Satting well on 2 L nasal cannula.   [PR]    Clinical Course User Index [PR] Willy Eddyobinson, Jomarie Gellis, MD    The patient was evaluated in Emergency Department today for the symptoms described in the history of present illness. He/she was evaluated in the context of the global COVID-19 pandemic, which necessitated consideration that the patient might be at risk for infection with the SARS-CoV-2 virus that causes COVID-19. Institutional protocols and algorithms that pertain to the evaluation of patients at risk for COVID-19 are in a state of rapid change based on information released by regulatory bodies including the CDC and federal and state organizations. These policies and algorithms were followed during the patient's care in the ED.  As part of my medical decision making, I reviewed the following data within the electronic MEDICAL RECORD NUMBER Nursing notes reviewed and incorporated, Labs reviewed, notes from prior ED visits and [place] Controlled Substance Database   ____________________________________________   FINAL CLINICAL IMPRESSION(S) / ED DIAGNOSES  Final  diagnoses:  Altered mental status, unspecified altered mental status type  Hypernatremia      NEW MEDICATIONS STARTED DURING THIS VISIT:  New Prescriptions   No medications on file     Note:  This document was prepared using Dragon voice recognition software and may include unintentional dictation errors.    Willy Eddyobinson, Yehudis Monceaux, MD 10/21/18 [phone]

## 2018-10-21 NOTE — Progress Notes (Signed)
New hospice home referral received from CSW Ruthe Mannan following a Palliative Medicine consult. Patient information faxed to referral. Writer to follow up with patient, family and hospital care team on 4/16. Thank you. Dayna Barker  BSN, RN, [facility] [phone]

## 2018-10-21 NOTE — Progress Notes (Signed)
#  1.  Acute encephalopathy.  The patient has a resolved right subdural hematoma secondary to recent fal.  His encephalopathy could be multifactorial secondary to volume depletion with mild dehydration as manifested by hypernatremia of 149 and elevated BUN and creatinine as well as polypharmacy.  He will be placed in observation in a medically monitored bed.  He will be placed on hydration with IV half-normal saline and will follow his neuro checks.  2.  Acute kidney injury.  This is likely prerenal secondary to dehydration.  He will be hydrated with half-normal saline and will follow his BMP in a.m. diuretic therapy will be held off.  3.  Hypernatremia.  Management as above.  This likely hypovolemic.  4.  Dementia with behavioral changes.  Aricept and Risperdal will be continued.  5.  BPH.  Flomax and Proscar will be resumed.  6.  Hypertension.  Will continue amlodipine  7.  Depression.  Myrbetriq and Paxil will be resumed.  8.  DVT prophylaxis.  Subcutaneous Lovenox.  Agree with above

## 2018-10-22 DIAGNOSIS — Z515 Encounter for palliative care: Secondary | ICD-10-CM

## 2018-10-22 DIAGNOSIS — R4182 Altered mental status, unspecified: Secondary | ICD-10-CM

## 2018-10-22 DIAGNOSIS — Z7189 Other specified counseling: Secondary | ICD-10-CM

## 2018-10-22 LAB — URINE CULTURE: Culture: NO GROWTH

## 2018-10-22 LAB — RPR: RPR Ser Ql: NONREACTIVE

## 2018-10-22 MED ORDER — POLYVINYL ALCOHOL 1.4 % OP SOLN: 1.0000 [drp] | Freq: Four times a day (QID) | OPHTHALMIC | 0 refills | Status: AC | PRN

## 2018-10-22 MED ORDER — LORAZEPAM 2 MG/ML PO CONC: 1.0000 mg | ORAL | 0 refills | Status: AC | PRN

## 2018-10-22 MED ORDER — MORPHINE SULFATE (CONCENTRATE) 10 MG/0.5ML PO SOLN: 5.0000 mg | ORAL | 0 refills | Status: AC | PRN

## 2018-10-22 MED ORDER — BIOTENE DRY MOUTH MT LIQD: 15.0000 mL | OROMUCOSAL | 0 refills | Status: AC | PRN

## 2018-10-22 NOTE — Progress Notes (Signed)
Observed pt attempt to open eyes to speech. Dried and mosit drainage, greenish color, surrounded eye preventing pt from opening. Provided pain medication di/t trauma of eye area from fall. Cleansed left eye so that pt could open eye. Applied warm cloth to right eye noticed tensing of pt with attempt to wipe eye. With tensing abruptly stopped rubbing of cloth on right eye and reapplied warm cloth to soften drainage.MD contacted eye drops ordered.

## 2018-10-22 NOTE — Progress Notes (Signed)
PMT note:  Patient is resting in bed. He opens his eyes to voice, but does not speak. No obvious distress. Plans for D/C today to hospice facility. No change in medication regimen.   15 min

## 2018-10-22 NOTE — Progress Notes (Signed)
Visit made to new hospice home referral. Patient seen lying in bed, eyes closed, did not rouse to name. Periods of apnea noted during visit. Writer spoke with patient's son Wallice today, he is in agreement with transfer to the hospice home today. Hospital care team updated. Plan is for EMS transport with a 5 pm pick up arranged by Clinical research associate. Signed DNR in place in discharge packet. Thank you. Dayna Barker BSN, RN, [facility] [phone]

## 2018-10-22 NOTE — Progress Notes (Addendum)
EMS arrived to pick up patient. Patient to be transported to hospice home. Called daughter Stanton Kidney to make aware that patient was leaving hospital. IV and foley left in place.

## 2018-10-22 NOTE — Progress Notes (Signed)
Nutrition Brief Note RD working remotely.  RD received consult for assessment of nutrition requirements/status. Chart reviewed. Patient now transitioning to comfort care.   No nutrition interventions warranted at this time. Please re-consult RD as needed.   Helane Rima, MS, RD, LDN Office: [phone] Pager: [phone] After Hours/Weekend Pager: [phone]

## 2018-10-22 NOTE — Discharge Summary (Signed)
[facility] - [place] at [facility]   PATIENT NAME: Andrew Henderson    MR#:  [id number]  DATE OF BIRTH:  [birth year]  DATE OF ADMISSION:  10/21/2018 ADMITTING PHYSICIAN: Hannah Beat, MD  DATE OF DISCHARGE: No discharge date for patient encounter.  PRIMARY CARE PHYSICIAN: Patient, No Pcp Per    ADMISSION DIAGNOSIS:  Hypernatremia [E87.0] Altered mental status, unspecified altered mental status type [R41.82]  DISCHARGE DIAGNOSIS:  Active Problems:   Encephalopathy acute   SECONDARY DIAGNOSIS:   Past Medical History:  Diagnosis Date  . Alzheimer's dementia (HCC)   . Enlarged prostate   . Hypertension   . Major depressive disorder     HOSPITAL COURSE:  *Acute encephalopathy Exact etiology unknown Suspected due to dehydration, hypernatremia, acute kidney injury, polypharmacy, and possible ischemic cerebrovascular accident, compounded by recent subdural hematoma and chronic dementia Patient's mental status continued to deteriorate while in house, neurology did see patient while in house, patient was made DNR/comfort care given long-term poor prognosis by family, patient was subsequently accepted to hospice house for expectant management  *Acute kidney injury Plan of care as stated above  *Hypernatremia Plan of care as stated above  *Dementia with behavioral changes Plan of care as stated above  DISCHARGE CONDITIONS:   guarded  CONSULTS OBTAINED:  Treatment Team:  Aroor, Dara Lords, MD  DRUG ALLERGIES:   Allergies  Allergen Reactions  . Nifedipine Er     DISCHARGE MEDICATIONS:   Allergies as of 10/22/2018      Reactions   Nifedipine Er       Medication List    TAKE these medications   acetaminophen 500 MG tablet Commonly known as:  TYLENOL Take 500 mg by mouth every 4 (four) hours as needed for fever.   amLODipine 5 MG tablet Commonly known as:  NORVASC Take 5 mg by mouth daily.   antiseptic oral rinse Liqd Apply 15 mLs  topically as needed for dry mouth.   aspirin EC 81 MG tablet Take 81 mg by mouth daily.   donepezil 10 MG tablet Commonly known as:  ARICEPT Take 10 mg by mouth at bedtime.   finasteride 5 MG tablet Commonly known as:  PROSCAR Take 5 mg by mouth daily.   guaifenesin 100 MG/5ML syrup Commonly known as:  ROBITUSSIN Take 200 mg by mouth every 6 (six) hours as needed for cough.   ibuprofen 400 MG tablet Commonly known as:  ADVIL,MOTRIN Take 400 mg by mouth 2 (two) times daily.   loperamide 2 MG capsule Commonly known as:  IMODIUM Take 2 mg by mouth as needed for diarrhea or loose stools.   loratadine 10 MG tablet Commonly known as:  CLARITIN Take 10 mg by mouth daily.   LORazepam 2 MG/ML concentrated solution Commonly known as:  ATIVAN Place 0.5 mLs (1 mg total) under the tongue every 4 (four) hours as needed for anxiety.   magnesium hydroxide 400 MG/5ML suspension Commonly known as:  MILK OF MAGNESIA Take 30 mLs by mouth at bedtime as needed for mild constipation.   medroxyPROGESTERone 10 MG tablet Commonly known as:  PROVERA Take 20 mg by mouth daily.   Melatonin 3 MG Tabs Take 6 mg by mouth at bedtime.   Mintox 200-200-20 MG/5ML suspension Generic drug:  alum & mag hydroxide-simeth Take 30 mLs by mouth as needed for indigestion or heartburn.   morphine CONCENTRATE 10 MG/0.5ML Soln concentrated solution Place 0.25 mLs (5 mg total) under the tongue every 2 (  two) hours as needed for moderate pain (or dyspnea).   Myrbetriq 25 MG Tb24 tablet Generic drug:  mirabegron ER Take 25 mg by mouth daily.   neomycin-bacitracin-polymyxin ointment Commonly known as:  NEOSPORIN Apply 1 application topically as needed for wound care.   omeprazole 20 MG capsule Commonly known as:  PRILOSEC Take 20 mg by mouth daily.   PARoxetine 40 MG tablet Commonly known as:  PAXIL Take 40 mg by mouth daily.   polyvinyl alcohol 1.4 % ophthalmic solution Commonly known as:  LIQUIFILM  TEARS Place 1 drop into both eyes 4 (four) times daily as needed for dry eyes.   risperiDONE 0.25 MG tablet Commonly known as:  RISPERDAL Take 0.25 mg by mouth 3 (three) times daily.   tamsulosin 0.4 MG Caps capsule Commonly known as:  FLOMAX Take 0.4 mg by mouth daily.   torsemide 20 MG tablet Commonly known as:  DEMADEX Take 20 mg by mouth daily.   Vitamin D3 125 MCG (5000 UT) Caps Take 5,000 Units by mouth daily.        DISCHARGE INSTRUCTIONS:      If you experience worsening of your admission symptoms, develop shortness of breath, life threatening emergency, suicidal or homicidal thoughts you must seek medical attention immediately by calling 911 or calling your MD immediately  if symptoms less severe.  You Must read complete instructions/literature along with all the possible adverse reactions/side effects for all the Medicines you take and that have been prescribed to you. Take any new Medicines after you have completely understood and accept all the possible adverse reactions/side effects.   Please note  You were cared for by a hospitalist during your hospital stay. If you have any questions about your discharge medications or the care you received while you were in the hospital after you are discharged, you can call the unit and asked to speak with the hospitalist on call if the hospitalist that took care of you is not available. Once you are discharged, your primary care physician will handle any further medical issues. Please note that NO REFILLS for any discharge medications will be authorized once you are discharged, as it is imperative that you return to your primary care physician (or establish a relationship with a primary care physician if you do not have one) for your aftercare needs so that they can reassess your need for medications and monitor your lab values.    Today   CHIEF COMPLAINT:   Chief Complaint  Patient presents with  . Altered Mental Status     HISTORY OF PRESENT ILLNESS:  85 y.o. Caucasian male from [facility] with a known history of Alzheimer's dementia, hypertension, major depressive disorder and BPH, who presented to the emergency room with acute onset of altered mental status with decreased responsiveness.  Patient had a recent fall with a 1 minute loss of consciousness.  He was recently evaluated for a 2.6 mm right subdural hematoma for which she had a follow-up head CT scan revealed no change in size.  It showed atrophy and chronic small vessel ischemic changes.  It also showed the right periorbital hematoma with no acute maxillofacial or cervical spine fracture on his maxillofacial and C-spine CTs.  He is a non-historian due to his advanced dementia and somnolence.  The patient had a right ear and eyebrow laceration that was sutured in addition to his right periorbital hematoma.  Upon presentation to the emergency room heart rate was 59 and otherwise vital signs were  within normal.  Pulse continues 85% however this was probably inaccurate as shortly after that pulse extremity was 97% on room air.  Labs were remarkable for a sodium of 149 with a creatinine 1.96 and a BUN of 33.  Albumin was 3.4.  Troponin I less than 0.03.  WBCs were 12.5 hemoglobin 12.7 hematocrit 40.4.  Urinalysis was unremarkable.  The patient had a noncontrasted head CT scan that came back negative for acute intracranial abnormalities.  His previously seen right-sided subdural hematoma is not well appreciated on today's exam.  It showed chronic atrophic and ischemic changes that are stable from previous study.  Portable chest x-ray showed no acute cardiopulmonary disease.  EKG showed normal sinus rhythm with rate of 88 with Q waves anteroseptally and inferiorly.  The patient was given 2 boluses of IV normal saline 500 mL each.  He will be admitted to an observation medically monitored bed for further evaluation and management.   VITAL SIGNS:  Blood pressure  113/83, pulse 73, temperature 98.6 F (37 C), temperature source Axillary, resp. rate 17, height  (1.88 m), weight 83.4 kg, SpO2 92 %.  I/O:    Intake/Output Summary (Last 24 hours) at 10/22/2018 0957 Last data filed at 10/22/2018 0402 Gross per 24 hour  Intake 328.67 ml  Output 975 ml  Net -646.33 ml    PHYSICAL EXAMINATION:  GENERAL:  85 y.o.-year-old patient lying in the bed with no acute distress.  EYES: Pupils equal, round, reactive to light and accommodation. No scleral icterus. Extraocular muscles intact.  HEENT: Head atraumatic, normocephalic. Oropharynx and nasopharynx clear.  NECK:  Supple, no jugular venous distention. No thyroid enlargement, no tenderness.  LUNGS: Normal breath sounds bilaterally, no wheezing, rales,rhonchi or crepitation. No use of accessory muscles of respiration.  CARDIOVASCULAR: S1, S2 normal. No murmurs, rubs, or gallops.  ABDOMEN: Soft, non-tender, non-distended. Bowel sounds present. No organomegaly or mass.  EXTREMITIES: No pedal edema, cyanosis, or clubbing.  NEUROLOGIC: Cranial nerves II through XII are intact. Muscle strength 5/5 in all extremities. Sensation intact. Gait not checked.  PSYCHIATRIC: The patient is alert and oriented x 3.  SKIN: No obvious rash, lesion, or ulcer.   DATA REVIEW:   CBC Recent Labs  Lab 10/21/18 0641  WBC 12.9*  HGB 12.3*  HCT 39.5  PLT 151    Chemistries  Recent Labs  Lab 10/21/18 0128 10/21/18 0641  NA 149* 146*  K 3.8 3.4*  CL 114* 112*  CO2 26 24  GLUCOSE 106* 97  BUN 33* 33*  CREATININE 1.96* 1.74*  CALCIUM 8.8* 8.6*  MG  --  2.3  AST 17  --   ALT 15  --   ALKPHOS 102  --   BILITOT 1.1  --     Cardiac Enzymes Recent Labs  Lab 10/21/18 0129  TROPONINI <0.03    Microbiology Results  Results for orders placed or performed during the hospital encounter of 10/21/18  Urine Culture     Status: None   Collection Time: 10/21/18  1:29 AM  Result Value Ref Range Status   Specimen  Description   Final    URINE, RANDOM Performed at [facility], [address] [place], [place] [address]    Special Requests   Final    NONE Performed at [facility], [address], [place], [place] [address]    Culture   Final    NO GROWTH Performed at [facility], [address] [place],  [place] [address]    Report Status 10/22/2018 FINAL  Final  Blood culture (routine x 2)     Status: None (Preliminary result)   Collection Time: 10/21/18  4:51 AM  Result Value Ref Range Status   Specimen Description BLOOD BLOOD LEFT HAND  Final   Special Requests   Final    BOTTLES DRAWN AEROBIC AND ANAEROBIC Blood Culture results may not be optimal due to an excessive volume of blood received in culture bottles   Culture   Final    NO GROWTH 1 DAY Performed at [facility], [address] [place], [place] [address]    Report Status PENDING  Incomplete  Blood culture (routine x 2)     Status: None (Preliminary result)   Collection Time: 10/21/18  4:51 AM  Result Value Ref Range Status   Specimen Description BLOOD BLOOD RIGHT FOREARM  Final   Special Requests   Final    BOTTLES DRAWN AEROBIC AND ANAEROBIC Blood Culture results may not be optimal due to an excessive volume of blood received in culture bottles   Culture   Final    NO GROWTH 1 DAY Performed at [facility], [address] [place], [place] [address]    Report Status PENDING  Incomplete  MRSA PCR Screening     Status: None   Collection Time: 10/21/18  6:47 AM  Result Value Ref Range Status   MRSA by PCR NEGATIVE NEGATIVE Final    Comment:        The GeneXpert MRSA Assay (FDA approved for NASAL specimens only), is one component of a comprehensive MRSA colonization surveillance program. It is not intended to diagnose MRSA infection nor to guide or monitor treatment for MRSA infections. Performed at [facility], [address] [place], [place] [address]      RADIOLOGY:  Ct Head Wo Contrast  Result Date: 10/21/2018 CLINICAL DATA:  Follow-up subdural hematoma EXAM: CT HEAD WITHOUT CONTRAST TECHNIQUE: Contiguous axial images were obtained from the base of the skull through the vertex without intravenous contrast. COMPARISON:  10/19/2018 FINDINGS: Brain: Diffuse atrophic changes and chronic white matter ischemic changes are again identified. The previously seen right-sided subdural hematoma is less well appreciated on today's exam. No new areas of hemorrhage are seen. No acute infarction is noted. Vascular: No hyperdense vessel or unexpected calcification. Skull: Normal. Negative for fracture or focal lesion. Sinuses/Orbits: No acute finding. Other: None. IMPRESSION: Previously seen right-sided subdural hematoma is not well appreciated on today's exam. Chronic atrophic and ischemic changes stable from the previous study. No new focal abnormality is noted. Electronically Signed   By: Alcide CleverMark  Lukens M.D.   On: 10/21/2018 02:32   Mr Brain Wo Contrast  Result Date: 10/21/2018 CLINICAL DATA:  Patient found down with continued unresponsiveness. EXAM: MRI HEAD WITHOUT CONTRAST TECHNIQUE: Multiplanar, multiecho pulse sequences of the brain and surrounding structures were obtained without intravenous contrast. COMPARISON:  Head CT 10/21/2018 FINDINGS: BRAIN: There is a focus of abnormal diffusion restriction within the peripheral right parietal lobe (2:42) measuring approximately 8 mm. There is a small amount of overlying extra-axial blood, at the site of previously identified subdural hematoma, measuring approximately 2 mm. The midline structures are normal. Multiple old small vessel infarcts including the right thalamus and the cerebellum. Diffuse confluent hyperintense T2-weighted signal within the periventricular, deep and juxtacortical white matter, most commonly due to chronic ischemic microangiopathy. There is diffuse advanced atrophy. There is chronic  microhemorrhage evidence in both cerebellar hemispheres  and the right thalamus. There is magnetic susceptibility effect within the subdural space at the posterior right convexity. No mass lesion. VASCULAR: The major intracranial arterial and venous sinus flow voids are normal. SKULL AND UPPER CERVICAL SPINE: Calvarial bone marrow signal is normal. There is no skull base mass. Visualized upper cervical spine and soft tissues are normal. SINUSES/ORBITS: No fluid levels or advanced mucosal thickening. No mastoid or middle ear effusion. The orbits are normal. IMPRESSION: 1. Small ductus of acute ischemia within the posterior right parietal lobe. 2. Small amount of residual subdural blood at the right posterior convexity. 3. Advanced atrophy and findings of chronic ischemic microangiopathy. Electronically Signed   By: Deatra Robinson M.D.   On: 10/21/2018 13:43   Dg Chest Portable 1 View  Result Date: 10/21/2018 CLINICAL DATA:  Shortness of breath EXAM: PORTABLE CHEST 1 VIEW COMPARISON:  10/19/2018 FINDINGS: Cardiac shadow is enlarged. Aortic calcifications are noted. Lungs are well aerated bilaterally. No focal infiltrate or sizable effusion is seen. Degenerative changes of the thoracic spine are noted. IMPRESSION: No acute abnormality noted. Electronically Signed   By: Alcide Clever M.D.   On: 10/21/2018 02:26    EKG:   Orders placed or performed during the hospital encounter of 10/21/18  . ED EKG  . ED EKG  . ED EKG  . ED EKG  . EKG 12-Lead  . EKG 12-Lead  . EKG 12-Lead  . EKG 12-Lead      Management plans discussed with the patient, family and they are in agreement.  CODE STATUS:     Code Status Orders  (From admission, onward)         Start     Ordered   10/21/18 1757  Do not attempt resuscitation (DNR)  Continuous    Question Answer Comment  In the event of cardiac or respiratory ARREST Do not call a "code blue"   In the event of cardiac or respiratory ARREST Do not perform  Intubation, CPR, defibrillation or ACLS   In the event of cardiac or respiratory ARREST Use medication by any route, position, wound care, and other measures to relive pain and suffering. May use oxygen, suction and manual treatment of airway obstruction as needed for comfort.   Comments nurse to pronounce. MOST form in [place]      10/21/18 1759        Code Status History    Date Active Date Inactive Code Status Order ID Comments User Context   10/21/2018 1559 10/21/2018 1759 DNR [id number]  Morton Stall, NP Inpatient   10/21/2018 0830 10/21/2018 1559 DNR [id number]  Bertrum Sol, MD Inpatient   10/21/2018 0502 10/21/2018 0830 Full Code [id number]  Mansy, Vernetta Honey, MD ED    Advance Directive Documentation     Most Recent Value  Type of Advance Directive  Healthcare Power of Attorney, Out of facility DNR (pink MOST or yellow form)  Pre-existing out of facility DNR order (yellow form or pink MOST form)  -- [Copy needs to be obtained]  "MOST" Form in Place?  -      TOTAL TIME TAKING CARE OF THIS PATIENT: 40 minutes.    Evelena Asa Tysean Vandervliet M.D on 10/22/2018 at 9:57 AM  Between 7am to 6pm - Pager - [phone]  After 6pm go to [email or web address] - password [email or web address]  [facility]  Office  [phone]  CC: Primary care physician; Patient, No Pcp Per   Note: This dictation was prepared with Dragon dictation along with  smaller phrase technology. Any transcriptional errors that result from this process are unintentional.

## 2018-10-22 NOTE — TOC Transition Note (Signed)
Transition of Care [facility]) - CM/SW Discharge Note   Patient Details  Name: Andrew Henderson MRN: [id number] Date of Birth: 29-Dec-1931  Transition of [facility] CM/SW Contact:  Ruthe Mannan, LCSWA Phone Number: 10/22/2018, 9:39 AM   Clinical Narrative:   Patient is medically stable for transport to Hospice facility today. Family is aware of discharge today. Patient will discharge to [facility] facility by EMS. Clydie Braun, hospice liaison will call report and call for transport.     Final next level of care: Hospice Medical Facility Barriers to Discharge: No Barriers Identified   Patient Goals and CMS Choice Patient states their goals for this hospitalization and ongoing recovery are:: comfort  CMS Medicare.gov Compare Post Acute Care list provided to:: Patient Represenative (must comment)(Son) Choice offered to / list presented to : Adult Children  Discharge Placement              Patient chooses bed at: [facility] ) Patient to be transferred to facility by: EMS Name of family member notified: Eppie Gibson- Son  Patient and family notified of of transfer: 10/22/18  Discharge Plan and Services In-house Referral: Hospice / Palliative Care   Post Acute Care Choice: Hospice                    Social Determinants of Health (SDOH) Interventions     Readmission Risk Interventions No flowsheet data found.

## 2018-10-26 LAB — CULTURE, BLOOD (ROUTINE X 2)
Culture: NO GROWTH
Culture: NO GROWTH

## 2018-11-06 DEATH — deceased

## 2020-05-07 IMAGING — DX PORTABLE CHEST - 1 VIEW
1 series · 1 of 1 positions shown · non-contrast
Comparison: 10/19/2018

CLINICAL DATA: Shortness of breath

EXAM:
PORTABLE CHEST 1 VIEW

[chest ap]
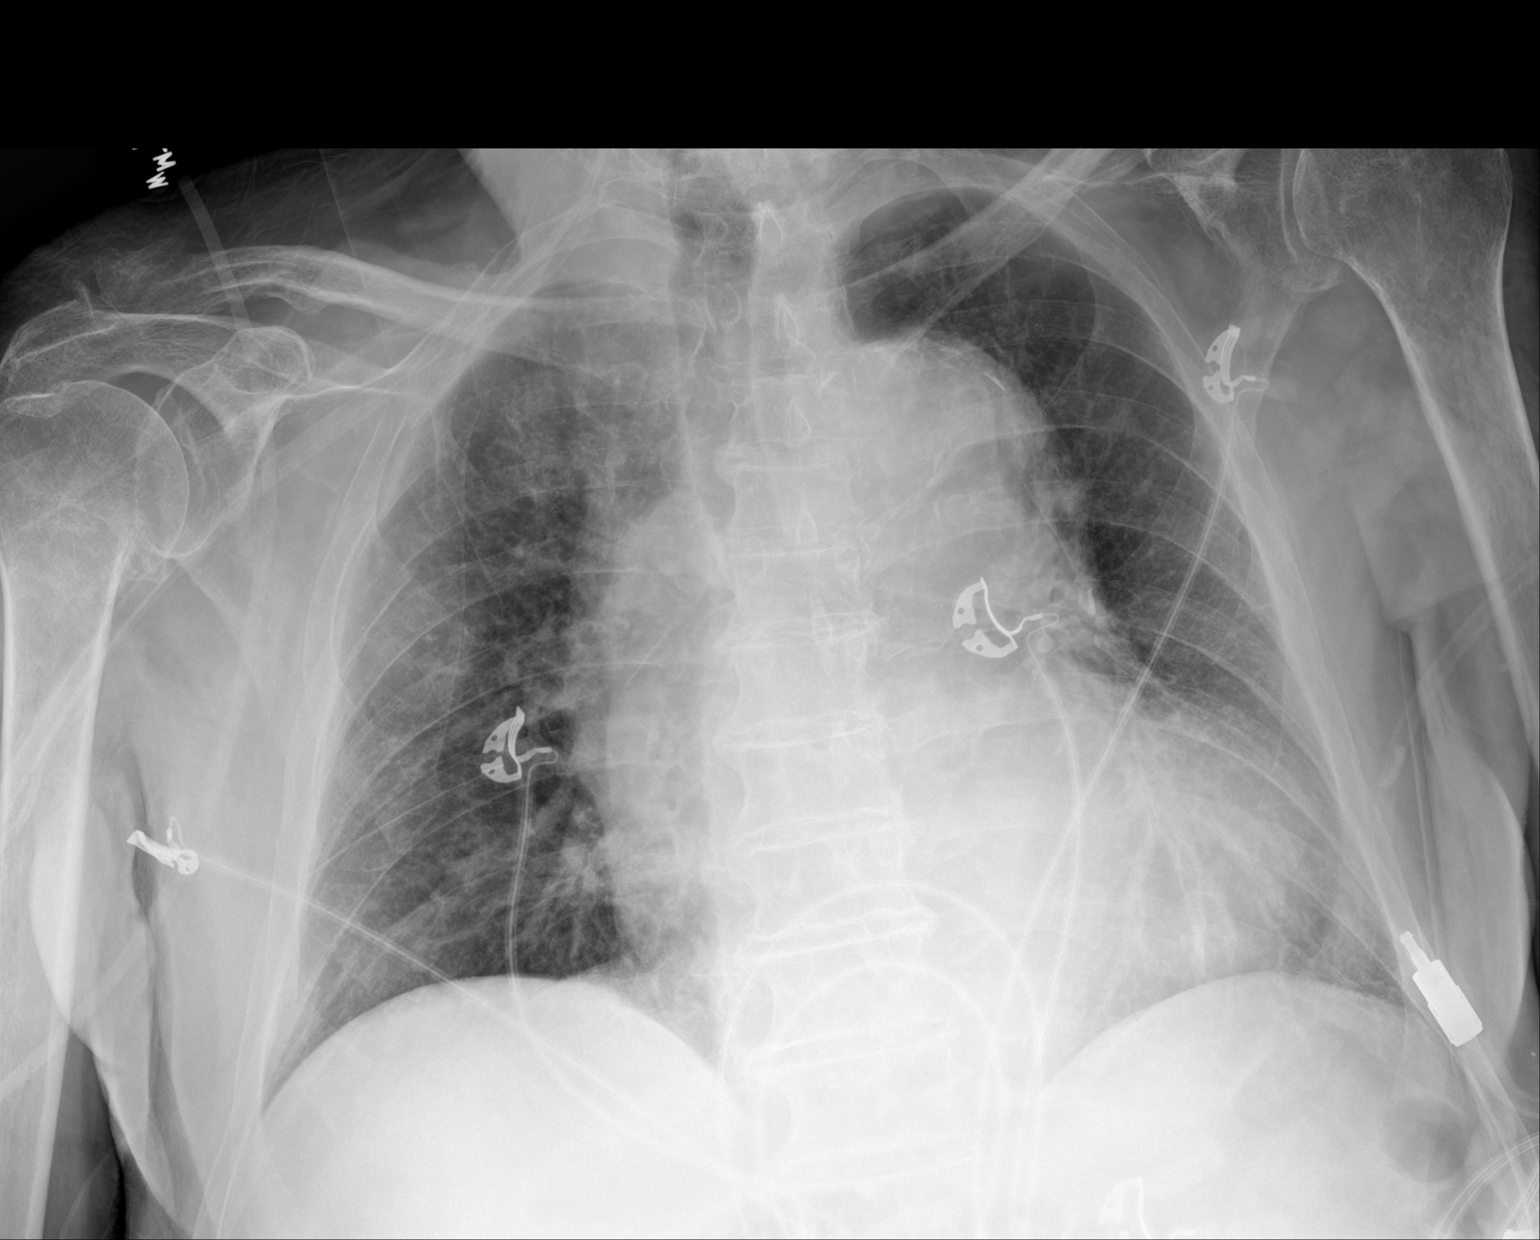

[1 of 1 positions shown; findings below may reference images not displayed]

FINDINGS: Cardiac shadow is enlarged. Aortic calcifications are noted. Lungs
are well aerated bilaterally. No focal infiltrate or sizable
effusion is seen. Degenerative changes of the thoracic spine are
noted.
IMPRESSION: No acute abnormality noted.

## 2020-05-07 IMAGING — CT CT HEAD WITHOUT CONTRAST
3 of 5 series · 15 of 47 positions shown, 18 images · non-contrast
Comparison: 10/19/2018

CLINICAL DATA: Follow-up subdural hematoma

EXAM:
CT HEAD WITHOUT CONTRAST
TECHNIQUE: Contiguous axial images were obtained from the base of the skull
through the vertex without intravenous contrast.

[Series 3: head wo · axial · 0.47mm/px · z∈[-85,+35]mm · 9 of 32 slices shown, 12 images]
[im 4/32  brain]
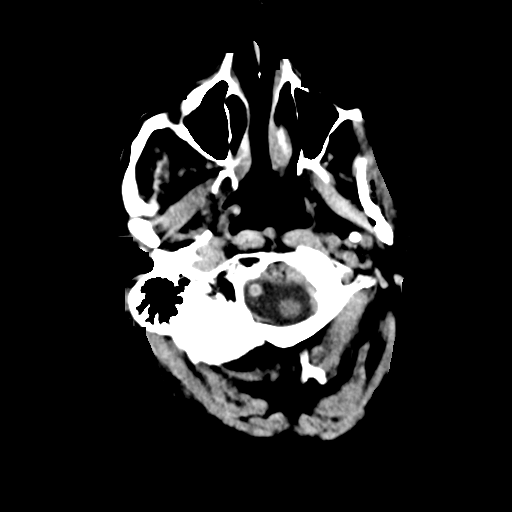
[im 4/32  bone]
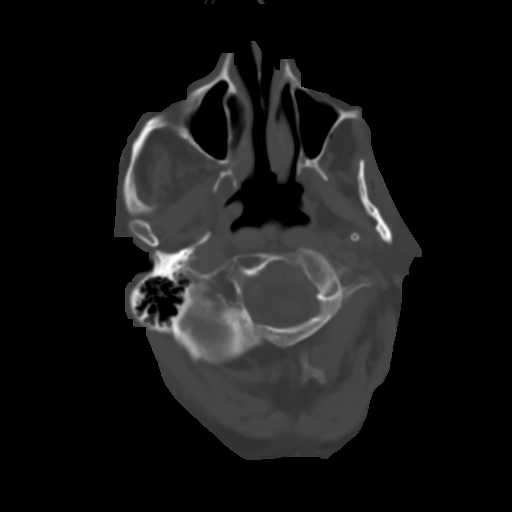
[im 7/32  brain]
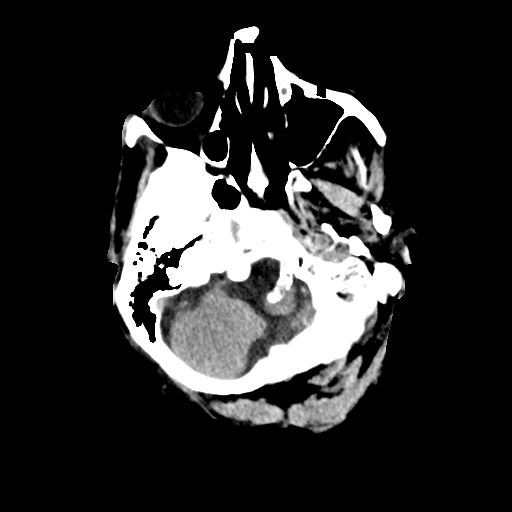
[im 10/32  brain]
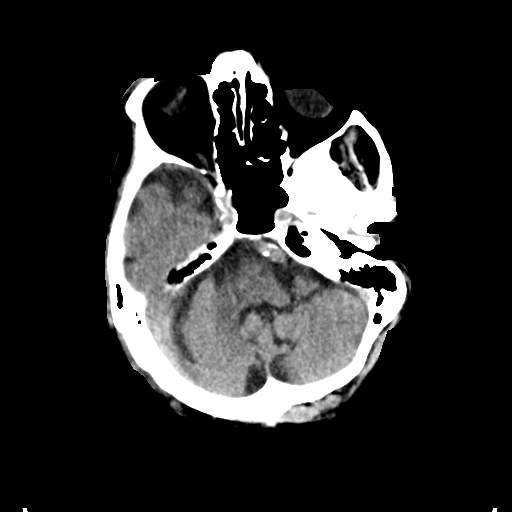
[im 13/32  brain]
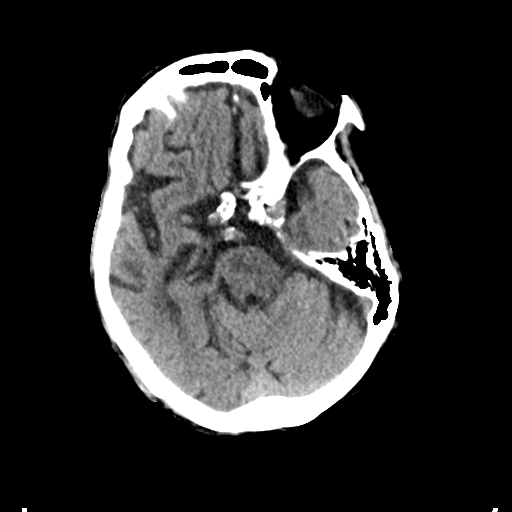
[im 16/32  brain]
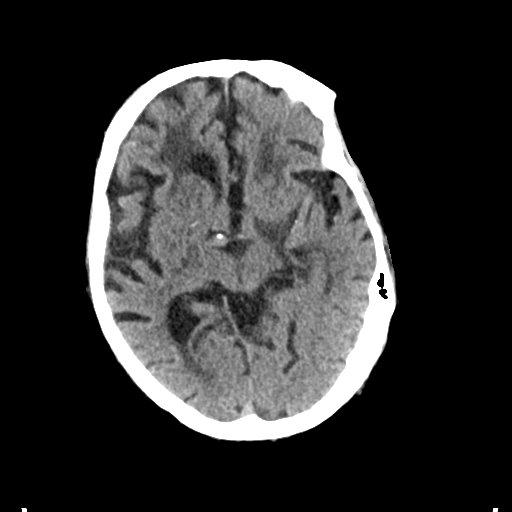
[im 16/32  bone]
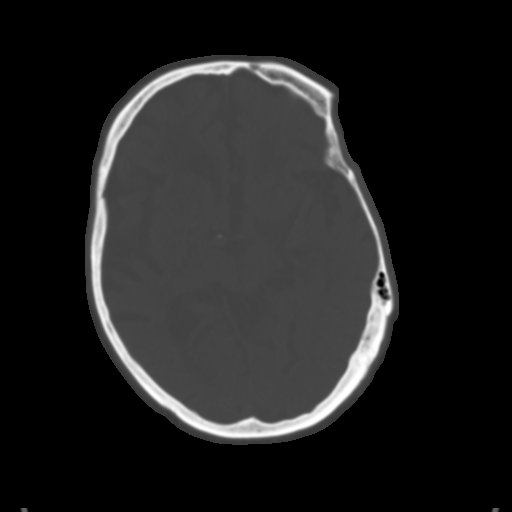
[im 19/32  brain]
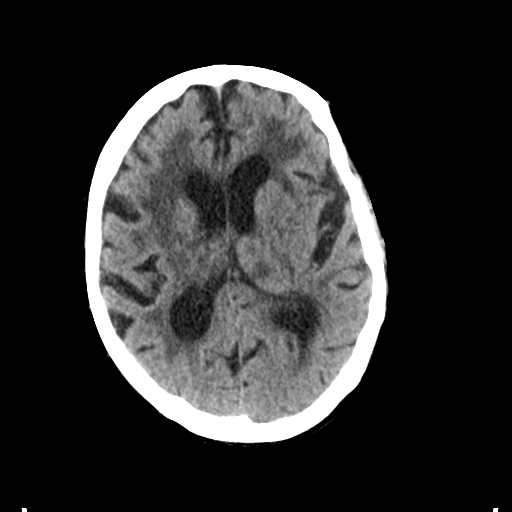
[im 22/32  brain]
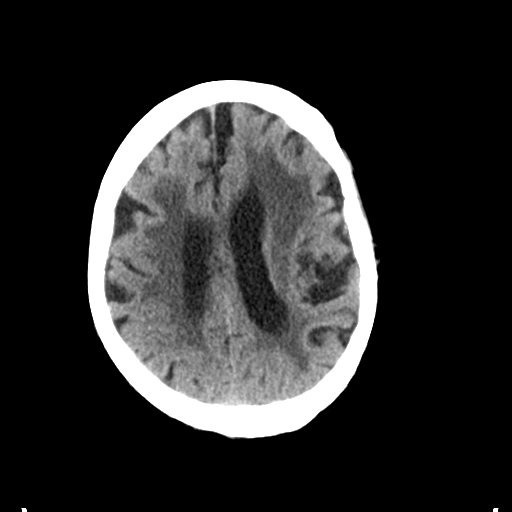
[im 25/32  brain]
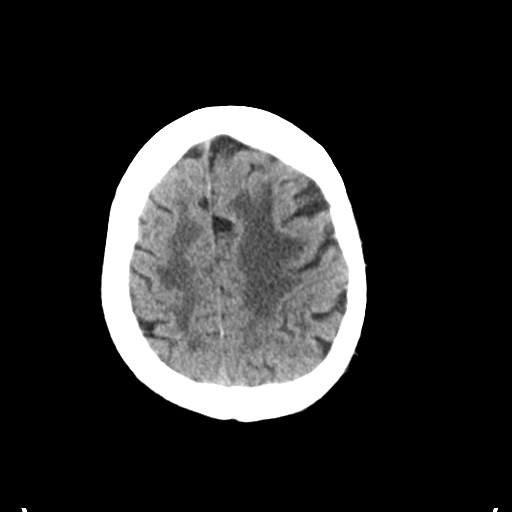
[im 28/32  brain]
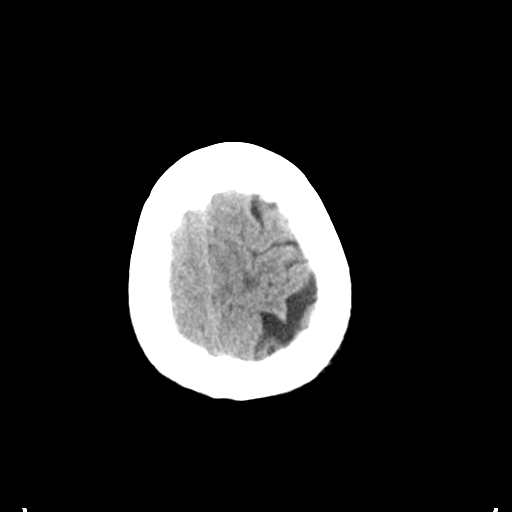
[im 28/32  bone]
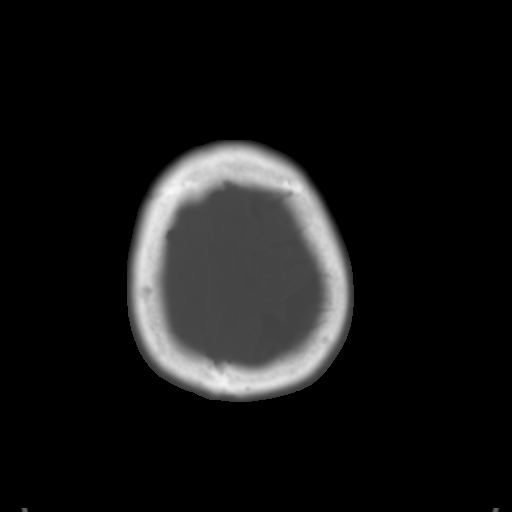

[Series 5: coronal soft tissue · coronal · 0.32mm/px · 3 of 78 slices shown]
[im 26/78  brain]
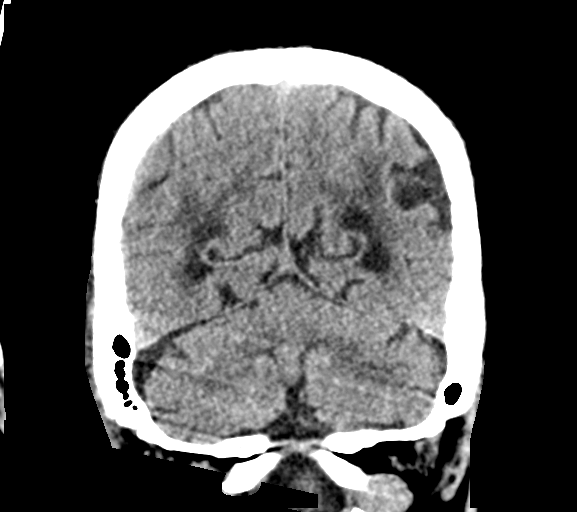
[im 35/78  brain]
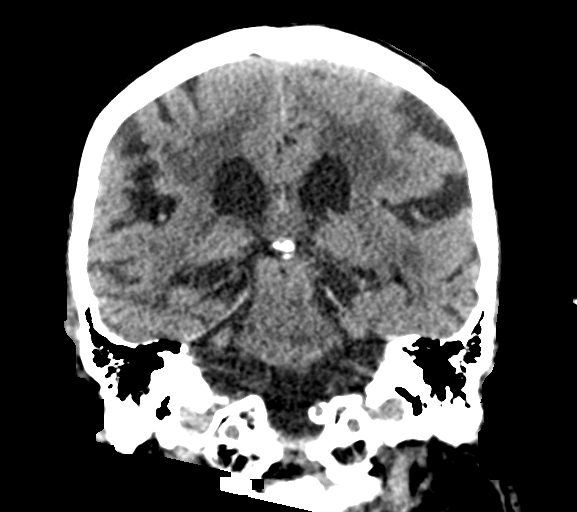
[im 43/78  brain]
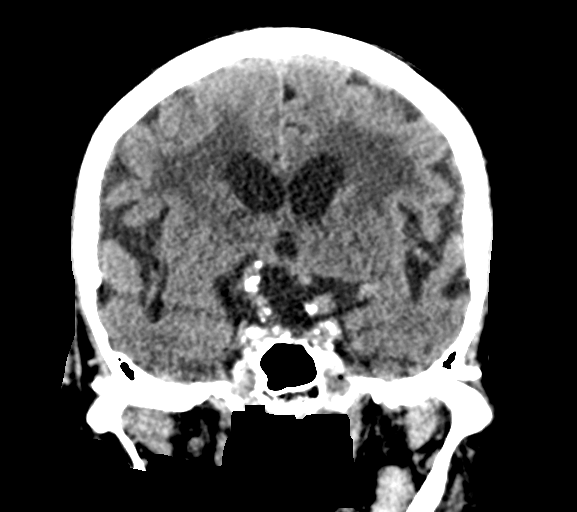

[Series 6: sagittal soft tissue · sagittal · 0.32mm/px · 3 of 60 slices shown]
[im 27/60  brain]
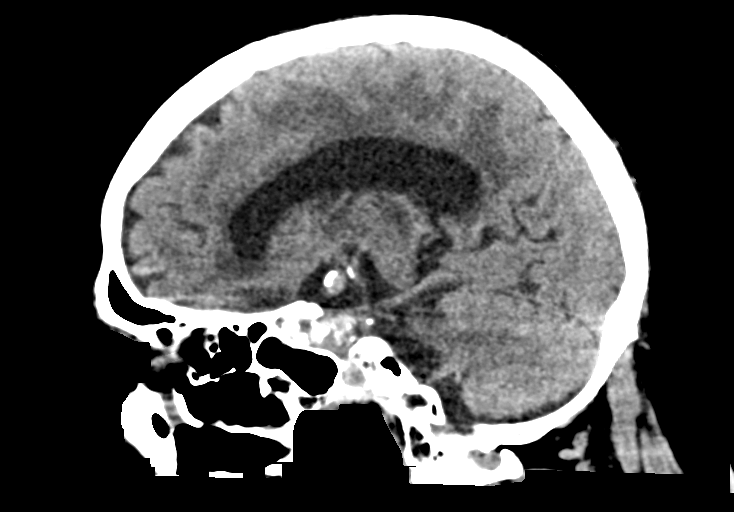
[im 32/60  brain]
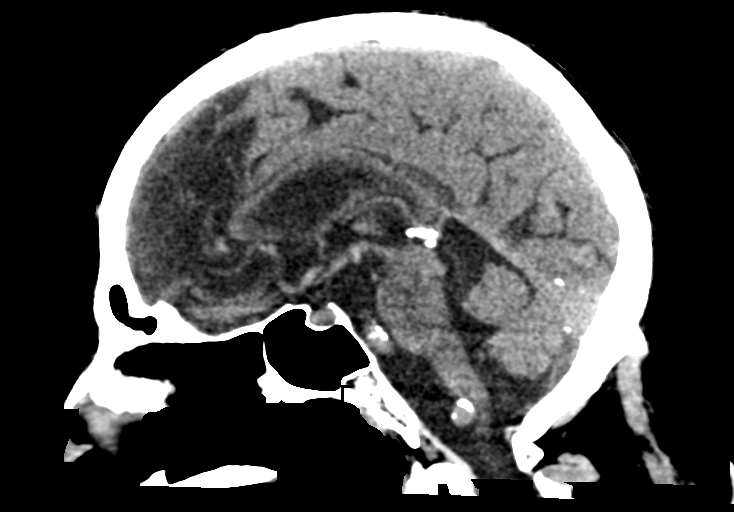
[im 36/60  brain]
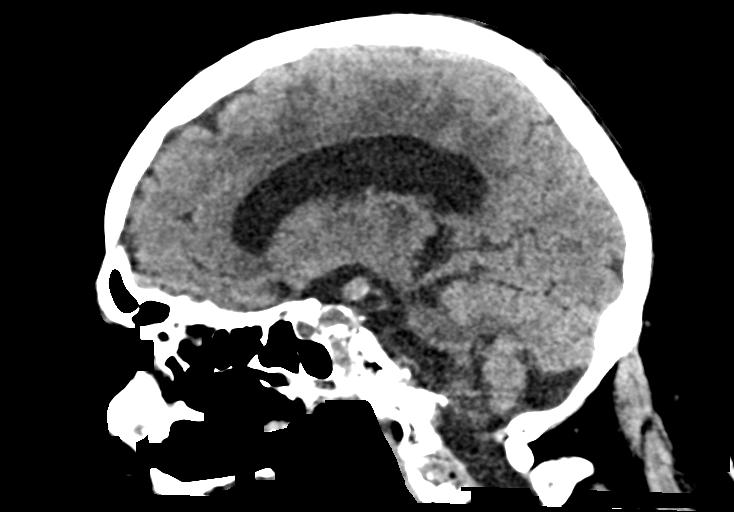

[15 of 47 positions shown; findings below may reference images not displayed]

FINDINGS: Brain: Diffuse atrophic changes and chronic white matter ischemic
changes are again identified. The previously seen right-sided
subdural hematoma is less well appreciated on today's exam. No new
areas of hemorrhage are seen. No acute infarction is noted.

Vascular: No hyperdense vessel or unexpected calcification.

Skull: Normal. Negative for fracture or focal lesion.

Sinuses/Orbits: No acute finding.

Other: None.
IMPRESSION: Previously seen right-sided subdural hematoma is not well
appreciated on today's exam.

Chronic atrophic and ischemic changes stable from the previous
study. No new focal abnormality is noted.
